# Patient Record
Sex: Female | Born: 1950 | Race: White | Hispanic: No | Marital: Married | Smoking: Never smoker
Health system: Southern US, Community
[De-identification: ages and names within clinical notes are randomized; demographics above are authoritative.]

## PROBLEM LIST (undated history)

## (undated) DIAGNOSIS — Z9889 Other specified postprocedural states: Secondary | ICD-10-CM

## (undated) DIAGNOSIS — R112 Nausea with vomiting, unspecified: Secondary | ICD-10-CM

## (undated) DIAGNOSIS — E785 Hyperlipidemia, unspecified: Secondary | ICD-10-CM

## (undated) DIAGNOSIS — J45909 Unspecified asthma, uncomplicated: Secondary | ICD-10-CM

## (undated) HISTORY — PX: BREAST BIOPSY: SHX20

---

## 1981-06-22 HISTORY — PX: DIAGNOSTIC LAPAROSCOPY: SUR761

## 2006-06-21 ENCOUNTER — Encounter: Admission: RE | Admit: 2006-06-21 | Discharge: 2006-06-21 | Payer: Self-pay | Admitting: Family Medicine

## 2006-06-23 ENCOUNTER — Encounter: Admission: RE | Admit: 2006-06-23 | Discharge: 2006-06-23 | Payer: Self-pay | Admitting: Family Medicine

## 2006-06-23 ENCOUNTER — Encounter (INDEPENDENT_AMBULATORY_CARE_PROVIDER_SITE_OTHER): Payer: Self-pay | Admitting: *Deleted

## 2007-01-04 ENCOUNTER — Encounter: Admission: RE | Admit: 2007-01-04 | Discharge: 2007-01-04 | Payer: Self-pay | Admitting: Family Medicine

## 2007-08-04 ENCOUNTER — Encounter: Admission: RE | Admit: 2007-08-04 | Discharge: 2007-08-04 | Payer: Self-pay | Admitting: Family Medicine

## 2007-08-17 ENCOUNTER — Encounter: Admission: RE | Admit: 2007-08-17 | Discharge: 2007-08-17 | Payer: Self-pay | Admitting: Family Medicine

## 2008-06-04 ENCOUNTER — Encounter: Admission: RE | Admit: 2008-06-04 | Discharge: 2008-06-04 | Payer: Self-pay | Admitting: Family Medicine

## 2009-07-29 ENCOUNTER — Encounter: Admission: RE | Admit: 2009-07-29 | Discharge: 2009-07-29 | Payer: Self-pay | Admitting: Family Medicine

## 2009-08-06 ENCOUNTER — Encounter: Admission: RE | Admit: 2009-08-06 | Discharge: 2009-08-06 | Payer: Self-pay | Admitting: Family Medicine

## 2010-07-12 ENCOUNTER — Other Ambulatory Visit: Payer: Self-pay | Admitting: Family Medicine

## 2010-07-12 DIAGNOSIS — Z1239 Encounter for other screening for malignant neoplasm of breast: Secondary | ICD-10-CM

## 2010-08-07 ENCOUNTER — Ambulatory Visit
Admission: RE | Admit: 2010-08-07 | Discharge: 2010-08-07 | Disposition: A | Discharge status: Home / Self Care | Payer: BC Managed Care – PPO | Source: Ambulatory Visit | Attending: Family Medicine | Admitting: Family Medicine

## 2010-08-07 DIAGNOSIS — Z1239 Encounter for other screening for malignant neoplasm of breast: Secondary | ICD-10-CM

## 2011-08-26 ENCOUNTER — Other Ambulatory Visit: Payer: Self-pay | Admitting: Family Medicine

## 2011-08-26 DIAGNOSIS — Z1231 Encounter for screening mammogram for malignant neoplasm of breast: Secondary | ICD-10-CM

## 2011-09-07 ENCOUNTER — Ambulatory Visit
Admission: RE | Admit: 2011-09-07 | Discharge: 2011-09-07 | Disposition: A | Discharge status: Home / Self Care | Payer: BC Managed Care – PPO | Source: Ambulatory Visit | Attending: Family Medicine | Admitting: Family Medicine

## 2011-09-07 DIAGNOSIS — Z1231 Encounter for screening mammogram for malignant neoplasm of breast: Secondary | ICD-10-CM

## 2013-05-17 ENCOUNTER — Other Ambulatory Visit: Payer: Self-pay

## 2013-05-17 DIAGNOSIS — Z1231 Encounter for screening mammogram for malignant neoplasm of breast: Secondary | ICD-10-CM

## 2013-06-26 ENCOUNTER — Ambulatory Visit
Admission: RE | Admit: 2013-06-26 | Discharge: 2013-06-26 | Disposition: A | Discharge status: Home / Self Care | Payer: BC Managed Care – PPO | Source: Ambulatory Visit

## 2013-06-26 DIAGNOSIS — Z1231 Encounter for screening mammogram for malignant neoplasm of breast: Secondary | ICD-10-CM

## 2013-11-30 DIAGNOSIS — J45909 Unspecified asthma, uncomplicated: Secondary | ICD-10-CM | POA: Insufficient documentation

## 2013-11-30 DIAGNOSIS — Z0000 Encounter for general adult medical examination without abnormal findings: Secondary | ICD-10-CM | POA: Insufficient documentation

## 2013-12-19 DIAGNOSIS — M545 Low back pain, unspecified: Secondary | ICD-10-CM | POA: Insufficient documentation

## 2014-12-28 ENCOUNTER — Other Ambulatory Visit: Payer: Self-pay

## 2014-12-28 DIAGNOSIS — Z1231 Encounter for screening mammogram for malignant neoplasm of breast: Secondary | ICD-10-CM

## 2015-01-01 ENCOUNTER — Ambulatory Visit
Admission: RE | Admit: 2015-01-01 | Discharge: 2015-01-01 | Disposition: A | Discharge status: Home / Self Care | Payer: BLUE CROSS/BLUE SHIELD | Source: Ambulatory Visit

## 2015-01-01 DIAGNOSIS — Z1231 Encounter for screening mammogram for malignant neoplasm of breast: Secondary | ICD-10-CM

## 2015-06-10 ENCOUNTER — Other Ambulatory Visit: Payer: Self-pay | Admitting: Neurosurgery

## 2015-06-10 DIAGNOSIS — M48061 Spinal stenosis, lumbar region without neurogenic claudication: Secondary | ICD-10-CM

## 2015-06-14 ENCOUNTER — Ambulatory Visit
Admission: RE | Admit: 2015-06-14 | Discharge: 2015-06-14 | Disposition: A | Discharge status: Home / Self Care | Payer: BLUE CROSS/BLUE SHIELD | Source: Ambulatory Visit | Attending: Neurosurgery | Admitting: Neurosurgery

## 2015-06-14 DIAGNOSIS — M48061 Spinal stenosis, lumbar region without neurogenic claudication: Secondary | ICD-10-CM

## 2015-06-14 MED ORDER — METHYLPREDNISOLONE ACETATE 40 MG/ML INJ SUSP (RADIOLOG
120.0000 mg | Freq: Once | INTRAMUSCULAR | Status: AC
  Administered 2015-06-14: 120 mg via EPIDURAL

## 2015-06-14 MED ORDER — IOHEXOL 180 MG/ML  SOLN
1.0000 mL | Freq: Once | INTRAMUSCULAR | Status: AC | PRN
  Administered 2015-06-14: 1 mL via EPIDURAL

## 2015-06-14 NOTE — Discharge Instructions (Signed)

## 2015-06-25 ENCOUNTER — Other Ambulatory Visit: Payer: BLUE CROSS/BLUE SHIELD

## 2015-09-02 ENCOUNTER — Other Ambulatory Visit: Payer: Self-pay | Admitting: Neurosurgery

## 2015-10-09 ENCOUNTER — Encounter (HOSPITAL_COMMUNITY)
Admission: RE | Admit: 2015-10-09 | Discharge: 2015-10-09 | Disposition: A | Discharge status: Home / Self Care | Payer: BLUE CROSS/BLUE SHIELD | Source: Ambulatory Visit | Attending: Neurosurgery | Admitting: Neurosurgery

## 2015-10-09 ENCOUNTER — Encounter (HOSPITAL_COMMUNITY): Payer: Self-pay

## 2015-10-09 ENCOUNTER — Other Ambulatory Visit (HOSPITAL_COMMUNITY): Payer: Self-pay | Admitting: *Deleted

## 2015-10-09 DIAGNOSIS — Z01812 Encounter for preprocedural laboratory examination: Secondary | ICD-10-CM | POA: Insufficient documentation

## 2015-10-09 DIAGNOSIS — Z0183 Encounter for blood typing: Secondary | ICD-10-CM | POA: Diagnosis not present

## 2015-10-09 DIAGNOSIS — M4316 Spondylolisthesis, lumbar region: Secondary | ICD-10-CM | POA: Diagnosis not present

## 2015-10-09 HISTORY — DX: Unspecified asthma, uncomplicated: J45.909

## 2015-10-09 HISTORY — DX: Hyperlipidemia, unspecified: E78.5

## 2015-10-09 HISTORY — DX: Nausea with vomiting, unspecified: R11.2

## 2015-10-09 HISTORY — DX: Other specified postprocedural states: Z98.890

## 2015-10-09 LAB — SURGICAL PCR SCREEN
MRSA, PCR: NEGATIVE
Staphylococcus aureus: POSITIVE — AB

## 2015-10-09 LAB — CBC
HEMATOCRIT: 41.5 % (ref 36.0–46.0)
HEMOGLOBIN: 13.3 g/dL (ref 12.0–15.0)
MCH: 28.5 pg (ref 26.0–34.0)
MCHC: 32 g/dL (ref 30.0–36.0)
MCV: 89.1 fL (ref 78.0–100.0)
Platelets: 221 10*3/uL (ref 150–400)
RBC: 4.66 MIL/uL (ref 3.87–5.11)
RDW: 13 % (ref 11.5–15.5)
WBC: 4.8 10*3/uL (ref 4.0–10.5)

## 2015-10-09 LAB — TYPE AND SCREEN
ABO/RH(D): B POS
ANTIBODY SCREEN: NEGATIVE

## 2015-10-09 LAB — ABO/RH: ABO/RH(D): B POS

## 2015-10-09 NOTE — Pre-Procedure Instructions (Signed)
    Bianca Andrews  10/09/2015      [id number] [place], [place] - [address] [place] [id number] Phone: [phone] Fax: [phone]    Your procedure is scheduled on 10-17-2015  Thursday .  [facility]:30 A.M.   Call this number if you have problems the morning of surgery:  [phone]   Remember:  Do not eat food or drink liquids after midnight.   Take these medicines the morning of surgery with A SIP OF WATER Tylenol if needed,albuterol inhaler if needed,Advair Diskus, venlafaxine(Effexor)             Stop all Aspirin,Ibuprofen,Aleve Advil ,Goody,s powders,vitamins over the counter supplements 5 days prior  to Surgery     Do not wear jewelry, make-up or nail polish.  Do not wear lotions, powders, or perfumes.  You may not wear deodorant.  Do not shave 48 hours prior to surgery.     Do not bring valuables to the hospital.  [facility] is not responsible for any belongings or valuables.  Contacts, dentures or bridgework may not be worn into surgery.  Leave your suitcase in the car.  After surgery it may be brought to your room.  For patients admitted to the hospital, discharge time will be determined by your treatment team.  Patients discharged the day of surgery will not be allowed to drive home.    Special instructions:  See attached Sheet for instructions on CHG showers  Please read over the following fact sheets that you were given. Pain Booklet and Surgical Site Infection Prevention

## 2015-10-09 NOTE — Progress Notes (Signed)
Pt. Notified of results of PCR. Mupirocin Rx. [facility] [place].

## 2015-10-16 MED ORDER — CEFAZOLIN SODIUM-DEXTROSE 2-4 GM/100ML-% IV SOLN
2.0000 g | INTRAVENOUS | Status: AC
  Administered 2015-10-17: 2 g via INTRAVENOUS
  Filled 2015-10-16: qty 100

## 2015-10-17 ENCOUNTER — Inpatient Hospital Stay (HOSPITAL_COMMUNITY): Payer: BLUE CROSS/BLUE SHIELD | Admitting: Certified Registered Nurse Anesthetist

## 2015-10-17 ENCOUNTER — Inpatient Hospital Stay (HOSPITAL_COMMUNITY)
Admission: RE | Admit: 2015-10-17 | Discharge: 2015-10-18 | DRG: 460 | Disposition: A | Discharge status: Home / Self Care | Payer: BLUE CROSS/BLUE SHIELD | Source: Ambulatory Visit | Attending: Neurosurgery | Admitting: Neurosurgery

## 2015-10-17 ENCOUNTER — Inpatient Hospital Stay (HOSPITAL_COMMUNITY): Payer: BLUE CROSS/BLUE SHIELD

## 2015-10-17 ENCOUNTER — Encounter (HOSPITAL_COMMUNITY)
Admission: RE | Disposition: A | Discharge status: Home / Self Care | Payer: Self-pay | Source: Ambulatory Visit | Attending: Neurosurgery

## 2015-10-17 ENCOUNTER — Encounter (HOSPITAL_COMMUNITY): Payer: Self-pay | Admitting: General Practice

## 2015-10-17 DIAGNOSIS — M4806 Spinal stenosis, lumbar region: Principal | ICD-10-CM | POA: Diagnosis present

## 2015-10-17 DIAGNOSIS — J45909 Unspecified asthma, uncomplicated: Secondary | ICD-10-CM | POA: Diagnosis present

## 2015-10-17 DIAGNOSIS — M5116 Intervertebral disc disorders with radiculopathy, lumbar region: Secondary | ICD-10-CM | POA: Diagnosis present

## 2015-10-17 DIAGNOSIS — M549 Dorsalgia, unspecified: Secondary | ICD-10-CM | POA: Diagnosis present

## 2015-10-17 DIAGNOSIS — E7800 Pure hypercholesterolemia, unspecified: Secondary | ICD-10-CM | POA: Diagnosis present

## 2015-10-17 DIAGNOSIS — M4316 Spondylolisthesis, lumbar region: Secondary | ICD-10-CM | POA: Diagnosis present

## 2015-10-17 DIAGNOSIS — Z419 Encounter for procedure for purposes other than remedying health state, unspecified: Secondary | ICD-10-CM

## 2015-10-17 HISTORY — PX: MAXIMUM ACCESS (MAS)POSTERIOR LUMBAR INTERBODY FUSION (PLIF) 1 LEVEL: SHX6368

## 2015-10-17 SURGERY — FOR MAXIMUM ACCESS (MAS) POSTERIOR LUMBAR INTERBODY FUSION (PLIF) 1 LEVEL
Anesthesia: General | Site: Back

## 2015-10-17 MED ORDER — FLEET ENEMA 7-19 GM/118ML RE ENEM: 1.0000 | ENEMA | Freq: Once | RECTAL | Status: DC | PRN

## 2015-10-17 MED ORDER — LIDOCAINE-EPINEPHRINE 1 %-1:100000 IJ SOLN
INTRAMUSCULAR | Status: DC | PRN
  Administered 2015-10-17: 5 mL

## 2015-10-17 MED ORDER — CALCIUM CARBONATE-VITAMIN D 500-200 MG-UNIT PO TABS
2.0000 | ORAL_TABLET | Freq: Every day | ORAL | Status: DC
Start: 2015-10-18 — End: 2015-10-18
  Administered 2015-10-18: 2 via ORAL
  Filled 2015-10-17: qty 2

## 2015-10-17 MED ORDER — ACETAMINOPHEN 500 MG PO TABS: 1000.0000 mg | ORAL_TABLET | Freq: Two times a day (BID) | ORAL | Status: DC | PRN

## 2015-10-17 MED ORDER — HYDROMORPHONE HCL 1 MG/ML IJ SOLN: 0.5000 mg | INTRAMUSCULAR | Status: DC | PRN

## 2015-10-17 MED ORDER — 0.9 % SODIUM CHLORIDE (POUR BTL) OPTIME
TOPICAL | Status: DC | PRN
  Administered 2015-10-17: 1000 mL

## 2015-10-17 MED ORDER — DEXTROSE 5 % IV SOLN
10.0000 mg | INTRAVENOUS | Status: DC | PRN
  Administered 2015-10-17: 30 ug/min via INTRAVENOUS

## 2015-10-17 MED ORDER — ADULT MULTIVITAMIN W/MINERALS CH
1.0000 | ORAL_TABLET | Freq: Every day | ORAL | Status: DC
  Filled 2015-10-17: qty 1

## 2015-10-17 MED ORDER — PROPOFOL 10 MG/ML IV BOLUS
INTRAVENOUS | Status: AC
  Filled 2015-10-17: qty 20

## 2015-10-17 MED ORDER — PANTOPRAZOLE SODIUM 40 MG PO TBEC
40.0000 mg | DELAYED_RELEASE_TABLET | Freq: Every day | ORAL | Status: DC
  Administered 2015-10-17 – 2015-10-18 (×2): 40 mg via ORAL
  Filled 2015-10-17 (×2): qty 1

## 2015-10-17 MED ORDER — BUPIVACAINE LIPOSOME 1.3 % IJ SUSP
INTRAMUSCULAR | Status: DC | PRN
  Administered 2015-10-17: 20 mL

## 2015-10-17 MED ORDER — DEXAMETHASONE SODIUM PHOSPHATE 10 MG/ML IJ SOLN
INTRAMUSCULAR | Status: AC
  Filled 2015-10-17: qty 1

## 2015-10-17 MED ORDER — SCOPOLAMINE 1 MG/3DAYS TD PT72
MEDICATED_PATCH | TRANSDERMAL | Status: DC
Start: 2015-10-17 — End: 2015-10-18
  Administered 2015-10-17: 1 via TRANSDERMAL
  Filled 2015-10-17: qty 1

## 2015-10-17 MED ORDER — ZOLPIDEM TARTRATE 5 MG PO TABS: 5.0000 mg | ORAL_TABLET | Freq: Every evening | ORAL | Status: DC | PRN

## 2015-10-17 MED ORDER — ACETAMINOPHEN 10 MG/ML IV SOLN
INTRAVENOUS | Status: AC
  Administered 2015-10-17: 1000 mg via INTRAVENOUS
  Filled 2015-10-17: qty 100

## 2015-10-17 MED ORDER — LACTATED RINGERS IV SOLN
INTRAVENOUS | Status: DC
  Administered 2015-10-17 (×2): via INTRAVENOUS

## 2015-10-17 MED ORDER — BUPIVACAINE HCL (PF) 0.5 % IJ SOLN
INTRAMUSCULAR | Status: DC | PRN
  Administered 2015-10-17: 5 mL

## 2015-10-17 MED ORDER — KCL IN DEXTROSE-NACL 20-5-0.45 MEQ/L-%-% IV SOLN: INTRAVENOUS | Status: DC

## 2015-10-17 MED ORDER — MOMETASONE FURO-FORMOTEROL FUM 100-5 MCG/ACT IN AERO
2.0000 | INHALATION_SPRAY | Freq: Two times a day (BID) | RESPIRATORY_TRACT | Status: DC
  Administered 2015-10-18: 2 via RESPIRATORY_TRACT
  Filled 2015-10-17: qty 8.8

## 2015-10-17 MED ORDER — SENNOSIDES-DOCUSATE SODIUM 8.6-50 MG PO TABS: 1.0000 | ORAL_TABLET | Freq: Every evening | ORAL | Status: DC | PRN

## 2015-10-17 MED ORDER — CEFAZOLIN SODIUM-DEXTROSE 2-4 GM/100ML-% IV SOLN
2.0000 g | Freq: Three times a day (TID) | INTRAVENOUS | Status: AC
  Administered 2015-10-17 (×2): 2 g via INTRAVENOUS
  Filled 2015-10-17 (×2): qty 100

## 2015-10-17 MED ORDER — SUCCINYLCHOLINE CHLORIDE 20 MG/ML IJ SOLN
INTRAMUSCULAR | Status: DC | PRN
  Administered 2015-10-17: 100 mg via INTRAVENOUS

## 2015-10-17 MED ORDER — ONDANSETRON HCL 4 MG/2ML IJ SOLN: 4.0000 mg | INTRAMUSCULAR | Status: DC | PRN

## 2015-10-17 MED ORDER — ALBUTEROL SULFATE (2.5 MG/3ML) 0.083% IN NEBU: 2.5000 mg | INHALATION_SOLUTION | Freq: Four times a day (QID) | RESPIRATORY_TRACT | Status: DC | PRN

## 2015-10-17 MED ORDER — ONDANSETRON HCL 4 MG/2ML IJ SOLN
INTRAMUSCULAR | Status: AC
  Filled 2015-10-17: qty 2

## 2015-10-17 MED ORDER — ONDANSETRON HCL 4 MG/2ML IJ SOLN
INTRAMUSCULAR | Status: DC | PRN
  Administered 2015-10-17: 4 mg via INTRAVENOUS

## 2015-10-17 MED ORDER — BUPIVACAINE LIPOSOME 1.3 % IJ SUSP
20.0000 mL | INTRAMUSCULAR | Status: DC
  Filled 2015-10-17: qty 20

## 2015-10-17 MED ORDER — CALCIUM CARB-CHOLECALCIFEROL 600-800 MG-UNIT PO TABS: 2.0000 | ORAL_TABLET | Freq: Every day | ORAL | Status: DC

## 2015-10-17 MED ORDER — METHOCARBAMOL 500 MG PO TABS
500.0000 mg | ORAL_TABLET | Freq: Four times a day (QID) | ORAL | Status: DC | PRN
  Administered 2015-10-17 – 2015-10-18 (×3): 500 mg via ORAL
  Filled 2015-10-17 (×3): qty 1

## 2015-10-17 MED ORDER — SUCCINYLCHOLINE CHLORIDE 200 MG/10ML IV SOSY
PREFILLED_SYRINGE | INTRAVENOUS | Status: AC
  Filled 2015-10-17: qty 10

## 2015-10-17 MED ORDER — MENTHOL 3 MG MT LOZG: 1.0000 | LOZENGE | OROMUCOSAL | Status: DC | PRN

## 2015-10-17 MED ORDER — BISACODYL 10 MG RE SUPP: 10.0000 mg | Freq: Every day | RECTAL | Status: DC | PRN

## 2015-10-17 MED ORDER — ACETAMINOPHEN 650 MG RE SUPP: 650.0000 mg | RECTAL | Status: DC | PRN

## 2015-10-17 MED ORDER — DEXAMETHASONE SODIUM PHOSPHATE 10 MG/ML IJ SOLN
INTRAMUSCULAR | Status: DC | PRN
  Administered 2015-10-17: 10 mg via INTRAVENOUS

## 2015-10-17 MED ORDER — DIPHENHYDRAMINE HCL 50 MG/ML IJ SOLN
INTRAMUSCULAR | Status: DC | PRN
  Administered 2015-10-17: 12.5 mg via INTRAVENOUS

## 2015-10-17 MED ORDER — METHOCARBAMOL 1000 MG/10ML IJ SOLN
500.0000 mg | Freq: Four times a day (QID) | INTRAVENOUS | Status: DC | PRN
  Filled 2015-10-17: qty 5

## 2015-10-17 MED ORDER — PHENOL 1.4 % MT LIQD: 1.0000 | OROMUCOSAL | Status: DC | PRN

## 2015-10-17 MED ORDER — PROPOFOL 10 MG/ML IV BOLUS
INTRAVENOUS | Status: DC | PRN
  Administered 2015-10-17: 150 mg via INTRAVENOUS
  Administered 2015-10-17: 50 mg via INTRAVENOUS

## 2015-10-17 MED ORDER — THROMBIN 20000 UNITS EX SOLR
CUTANEOUS | Status: DC | PRN
  Administered 2015-10-17: 20 mL via TOPICAL

## 2015-10-17 MED ORDER — OXYCODONE-ACETAMINOPHEN 5-325 MG PO TABS
1.0000 | ORAL_TABLET | ORAL | Status: DC | PRN
  Administered 2015-10-17 – 2015-10-18 (×3): 1 via ORAL
  Filled 2015-10-17 (×3): qty 1

## 2015-10-17 MED ORDER — SODIUM CHLORIDE 0.9% FLUSH
3.0000 mL | Freq: Two times a day (BID) | INTRAVENOUS | Status: DC
  Administered 2015-10-17 (×2): 3 mL via INTRAVENOUS

## 2015-10-17 MED ORDER — ACETAMINOPHEN 325 MG PO TABS: 650.0000 mg | ORAL_TABLET | ORAL | Status: DC | PRN

## 2015-10-17 MED ORDER — ARTIFICIAL TEARS OP OINT
TOPICAL_OINTMENT | OPHTHALMIC | Status: AC
  Filled 2015-10-17: qty 3.5

## 2015-10-17 MED ORDER — HYDROCODONE-ACETAMINOPHEN 5-325 MG PO TABS: 1.0000 | ORAL_TABLET | ORAL | Status: DC | PRN

## 2015-10-17 MED ORDER — ROCURONIUM BROMIDE 50 MG/5ML IV SOLN
INTRAVENOUS | Status: AC
  Filled 2015-10-17: qty 1

## 2015-10-17 MED ORDER — PANTOPRAZOLE SODIUM 40 MG IV SOLR: 40.0000 mg | Freq: Every day | INTRAVENOUS | Status: DC

## 2015-10-17 MED ORDER — FENTANYL CITRATE (PF) 100 MCG/2ML IJ SOLN
INTRAMUSCULAR | Status: DC | PRN
  Administered 2015-10-17 (×3): 50 ug via INTRAVENOUS
  Administered 2015-10-17: 100 ug via INTRAVENOUS

## 2015-10-17 MED ORDER — FENTANYL CITRATE (PF) 250 MCG/5ML IJ SOLN
INTRAMUSCULAR | Status: AC
  Filled 2015-10-17: qty 5

## 2015-10-17 MED ORDER — MIDAZOLAM HCL 2 MG/2ML IJ SOLN
INTRAMUSCULAR | Status: AC
  Filled 2015-10-17: qty 2

## 2015-10-17 MED ORDER — MIDAZOLAM HCL 5 MG/5ML IJ SOLN
INTRAMUSCULAR | Status: DC | PRN
  Administered 2015-10-17: 2 mg via INTRAVENOUS

## 2015-10-17 MED ORDER — SODIUM CHLORIDE 0.9% FLUSH: 3.0000 mL | INTRAVENOUS | Status: DC | PRN

## 2015-10-17 MED ORDER — LIDOCAINE HCL (CARDIAC) 20 MG/ML IV SOLN
INTRAVENOUS | Status: DC | PRN
  Administered 2015-10-17: 40 mg via INTRATRACHEAL
  Administered 2015-10-17: 60 mg via INTRAVENOUS

## 2015-10-17 MED ORDER — ALUM & MAG HYDROXIDE-SIMETH 200-200-20 MG/5ML PO SUSP: 30.0000 mL | Freq: Four times a day (QID) | ORAL | Status: DC | PRN

## 2015-10-17 MED ORDER — HYDROMORPHONE HCL 1 MG/ML IJ SOLN
INTRAMUSCULAR | Status: AC
  Filled 2015-10-17: qty 1

## 2015-10-17 MED ORDER — HYDROMORPHONE HCL 1 MG/ML IJ SOLN
0.2500 mg | INTRAMUSCULAR | Status: DC | PRN
  Administered 2015-10-17: 0.5 mg via INTRAVENOUS

## 2015-10-17 MED ORDER — DOCUSATE SODIUM 100 MG PO CAPS
100.0000 mg | ORAL_CAPSULE | Freq: Two times a day (BID) | ORAL | Status: DC
Start: 2015-10-17 — End: 2015-10-18
  Administered 2015-10-17 – 2015-10-18 (×2): 100 mg via ORAL
  Filled 2015-10-17 (×2): qty 1

## 2015-10-17 MED ORDER — DIPHENHYDRAMINE HCL 50 MG/ML IJ SOLN
INTRAMUSCULAR | Status: AC
  Filled 2015-10-17: qty 1

## 2015-10-17 MED ORDER — VENLAFAXINE HCL ER 37.5 MG PO CP24
37.5000 mg | ORAL_CAPSULE | Freq: Every day | ORAL | Status: DC
  Filled 2015-10-17: qty 1

## 2015-10-17 MED ORDER — PROPOFOL 1000 MG/100ML IV EMUL
INTRAVENOUS | Status: AC
  Administered 2015-10-17: 30 ug/kg/min via INTRAVENOUS
  Filled 2015-10-17: qty 100

## 2015-10-17 SURGICAL SUPPLY — 87 items
ADH SKN CLS APL DERMABOND .7 (GAUZE/BANDAGES/DRESSINGS) ×1
APL SKNCLS STERI-STRIP NONHPOA (GAUZE/BANDAGES/DRESSINGS) ×1
BENZOIN TINCTURE PRP APPL 2/3 (GAUZE/BANDAGES/DRESSINGS) ×3 IMPLANT
BIT DRILL PLIF MAS 5.0MM DISP (DRILL) IMPLANT
BLADE CLIPPER SURG (BLADE) IMPLANT
BONE CANC CHIPS 20CC PCAN1/4 (Bone Implant) ×3 IMPLANT
BUR MATCHSTICK NEURO 3.0 LAGG (BURR) ×3 IMPLANT
BUR ROUND FLUTED 5 RND (BURR) ×2 IMPLANT
BUR ROUND FLUTED 5MM RND (BURR) ×1
CAGE COROENT MP 8X9X23M-8 SPIN (Cage) ×4 IMPLANT
CANISTER SUCT 3000ML PPV (MISCELLANEOUS) ×3 IMPLANT
CHIPS CANC BONE 20CC PCAN1/4 (Bone Implant) ×1 IMPLANT
CLIP NEUROVISION LG (CLIP) ×3 IMPLANT
CLOSURE WOUND 1/2 X4 (GAUZE/BANDAGES/DRESSINGS) ×1
CONT SPEC 4OZ CLIKSEAL STRL BL (MISCELLANEOUS) ×3 IMPLANT
COVER BACK TABLE 24X17X13 BIG (DRAPES) IMPLANT
COVER BACK TABLE 60X90IN (DRAPES) ×3 IMPLANT
DECANTER SPIKE VIAL GLASS SM (MISCELLANEOUS) ×3 IMPLANT
DERMABOND ADVANCED (GAUZE/BANDAGES/DRESSINGS) ×2
DERMABOND ADVANCED .7 DNX12 (GAUZE/BANDAGES/DRESSINGS) ×1 IMPLANT
DRAPE C-ARM 42X72 X-RAY (DRAPES) ×3 IMPLANT
DRAPE C-ARMOR (DRAPES) ×3 IMPLANT
DRAPE LAPAROTOMY 100X72X124 (DRAPES) ×3 IMPLANT
DRAPE POUCH INSTRU U-SHP 10X18 (DRAPES) ×3 IMPLANT
DRAPE SURG 17X23 STRL (DRAPES) ×3 IMPLANT
DRILL PLIF MAS 5.0MM DISP (DRILL) ×3
DRSG OPSITE POSTOP 4X6 (GAUZE/BANDAGES/DRESSINGS) ×2 IMPLANT
DURAPREP 26ML APPLICATOR (WOUND CARE) ×3 IMPLANT
ELECT REM PT RETURN 9FT ADLT (ELECTROSURGICAL) ×3
ELECTRODE REM PT RTRN 9FT ADLT (ELECTROSURGICAL) ×1 IMPLANT
EVACUATOR 1/8 PVC DRAIN (DRAIN) ×3 IMPLANT
GAUZE SPONGE 4X4 12PLY STRL (GAUZE/BANDAGES/DRESSINGS) ×3 IMPLANT
GAUZE SPONGE 4X4 16PLY XRAY LF (GAUZE/BANDAGES/DRESSINGS) IMPLANT
GLOVE BIO SURGEON STRL SZ8 (GLOVE) ×3 IMPLANT
GLOVE BIOGEL PI IND STRL 8 (GLOVE) ×1 IMPLANT
GLOVE BIOGEL PI IND STRL 8.5 (GLOVE) ×1 IMPLANT
GLOVE BIOGEL PI INDICATOR 8 (GLOVE) ×2
GLOVE BIOGEL PI INDICATOR 8.5 (GLOVE) ×2
GLOVE ECLIPSE 8.0 STRL XLNG CF (GLOVE) ×3 IMPLANT
GLOVE EXAM NITRILE LRG STRL (GLOVE) IMPLANT
GLOVE EXAM NITRILE MD LF STRL (GLOVE) IMPLANT
GLOVE EXAM NITRILE XL STR (GLOVE) IMPLANT
GLOVE EXAM NITRILE XS STR PU (GLOVE) IMPLANT
GOWN STRL REUS W/ TWL LRG LVL3 (GOWN DISPOSABLE) IMPLANT
GOWN STRL REUS W/ TWL XL LVL3 (GOWN DISPOSABLE) ×1 IMPLANT
GOWN STRL REUS W/TWL 2XL LVL3 (GOWN DISPOSABLE) ×3 IMPLANT
GOWN STRL REUS W/TWL LRG LVL3 (GOWN DISPOSABLE)
GOWN STRL REUS W/TWL XL LVL3 (GOWN DISPOSABLE) ×3
GRAFT BNE CANC CHIPS 1-8 20CC (Bone Implant) IMPLANT
KIT BASIN OR (CUSTOM PROCEDURE TRAY) ×3 IMPLANT
KIT NEEDLE NVM5 EMG ELECT (KITS) ×1 IMPLANT
KIT NEEDLE NVM5 EMG ELECTRODE (KITS) ×2
KIT POSITION SURG JACKSON T1 (MISCELLANEOUS) ×3 IMPLANT
KIT ROOM TURNOVER OR (KITS) ×3 IMPLANT
MILL MEDIUM DISP (BLADE) ×3 IMPLANT
NDL HYPO 25X1 1.5 SAFETY (NEEDLE) ×1 IMPLANT
NDL SPNL 18GX3.5 QUINCKE PK (NEEDLE) IMPLANT
NEEDLE HYPO 25X1 1.5 SAFETY (NEEDLE) ×3 IMPLANT
NEEDLE SPNL 18GX3.5 QUINCKE PK (NEEDLE) IMPLANT
NS IRRIG 1000ML POUR BTL (IV SOLUTION) ×3 IMPLANT
PACK LAMINECTOMY NEURO (CUSTOM PROCEDURE TRAY) ×3 IMPLANT
PAD ARMBOARD 7.5X6 YLW CONV (MISCELLANEOUS) ×9 IMPLANT
PATTIES SURGICAL .5 X.5 (GAUZE/BANDAGES/DRESSINGS) IMPLANT
PATTIES SURGICAL .5 X1 (DISPOSABLE) IMPLANT
PATTIES SURGICAL 1X1 (DISPOSABLE) IMPLANT
ROD 5.5X40MM (Rod) ×4 IMPLANT
SCREW LOCK (Screw) ×12 IMPLANT
SCREW LOCK FXNS SPNE MAS PL (Screw) IMPLANT
SCREW MAS PLIF 5.5X30 (Screw) ×2 IMPLANT
SCREW PAS PLIF 5X30 (Screw) IMPLANT
SCREW SHANK 5.0X35 (Screw) ×6 IMPLANT
SCREW TULIP 5.5 (Screw) ×6 IMPLANT
SPONGE LAP 4X18 X RAY DECT (DISPOSABLE) IMPLANT
SPONGE SURGIFOAM ABS GEL 100 (HEMOSTASIS) ×3 IMPLANT
STAPLER SKIN PROX WIDE 3.9 (STAPLE) IMPLANT
STRIP CLOSURE SKIN 1/2X4 (GAUZE/BANDAGES/DRESSINGS) ×2 IMPLANT
SUT VIC AB 1 CT1 18XBRD ANBCTR (SUTURE) ×2 IMPLANT
SUT VIC AB 1 CT1 8-18 (SUTURE) ×6
SUT VIC AB 2-0 CT1 18 (SUTURE) ×6 IMPLANT
SUT VIC AB 3-0 SH 8-18 (SUTURE) ×6 IMPLANT
SYR 5ML LL (SYRINGE) IMPLANT
TAPE STRIPS DRAPE STRL (GAUZE/BANDAGES/DRESSINGS) ×2 IMPLANT
TOWEL OR 17X24 6PK STRL BLUE (TOWEL DISPOSABLE) ×3 IMPLANT
TOWEL OR 17X26 10 PK STRL BLUE (TOWEL DISPOSABLE) ×3 IMPLANT
TRAP SPECIMEN MUCOUS 40CC (MISCELLANEOUS) ×3 IMPLANT
TRAY FOLEY W/METER SILVER 14FR (SET/KITS/TRAYS/PACK) ×3 IMPLANT
WATER STERILE IRR 1000ML POUR (IV SOLUTION) ×3 IMPLANT

## 2015-10-17 NOTE — Anesthesia Procedure Notes (Signed)
Procedure Name: Intubation Date/Time: 10/17/2015 9:35 AM Performed by: [place] Pink Pre-anesthesia Checklist: Patient identified, Emergency Drugs available, Suction available, Patient being monitored and Timeout performed Patient Re-evaluated:Patient Re-evaluated prior to inductionOxygen Delivery Method: Circle system utilized Preoxygenation: Pre-oxygenation with 100% oxygen Intubation Type: IV induction Ventilation: Mask ventilation without difficulty Laryngoscope Size: Mac and 3 Grade View: Grade I Tube type: Oral Tube size: 7.0 mm Number of attempts: 1 Airway Equipment and Method: Stylet and LTA kit utilized Placement Confirmation: ETT inserted through vocal cords under direct vision,  positive ETCO2 and breath sounds checked- equal and bilateral Secured at: 20 cm Tube secured with: Tape Dental Injury: Teeth and Oropharynx as per pre-operative assessment

## 2015-10-17 NOTE — Anesthesia Preprocedure Evaluation (Signed)
Anesthesia Evaluation  Patient identified by MRN, date of birth, ID band Patient awake    Reviewed: Allergy & Precautions, NPO status , Patient's Chart, lab work & pertinent test results  History of Anesthesia Complications (+) PONV  Airway Mallampati: II  TM Distance: >3 FB Neck ROM: Full    Dental   Pulmonary asthma ,    breath sounds clear to auscultation       Cardiovascular negative cardio ROS   Rhythm:Regular Rate:Normal     Neuro/Psych    GI/Hepatic negative GI ROS, Neg liver ROS,   Endo/Other  negative endocrine ROS  Renal/GU negative Renal ROS     Musculoskeletal   Abdominal   Peds  Hematology   Anesthesia Other Findings   Reproductive/Obstetrics                             Anesthesia Physical Anesthesia Plan  ASA: III  Anesthesia Plan: General   Post-op Pain Management:    Induction: Intravenous  Airway Management Planned: Oral ETT  Additional Equipment:   Intra-op Plan:   Post-operative Plan: Extubation in OR  Informed Consent: I have reviewed the patients History and Physical, chart, labs and discussed the procedure including the risks, benefits and alternatives for the proposed anesthesia with the patient or authorized representative who has indicated his/her understanding and acceptance.   Dental advisory given  Plan Discussed with: CRNA and Anesthesiologist  Anesthesia Plan Comments:         Anesthesia Quick Evaluation

## 2015-10-17 NOTE — Interval H&P Note (Signed)
History and Physical Interval Note:  10/17/2015 9:18 AM  Bianca Andrews  has presented today for surgery, with the diagnosis of Spondylolisthesis, Lumbar region  The various methods of treatment have been discussed with the patient and family. After consideration of risks, benefits and other options for treatment, the patient has consented to  Procedure(s) with comments: L4-5 Maximum access posterior lumbar interbody fusion (N/A) - L4-5 Maximum access posterior lumbar interbody fusion as a surgical intervention .  The patient's history has been reviewed, patient examined, no change in status, stable for surgery.  I have reviewed the patient's chart and labs.  Questions were answered to the patient's satisfaction.     Clemente Dewey D

## 2015-10-17 NOTE — Anesthesia Postprocedure Evaluation (Signed)
Anesthesia Post Note  Patient: Bianca Andrews  Procedure(s) Performed: Procedure(s) (LRB): L4-5 Maximum access posterior lumbar interbody fusion (N/A)  Patient location during evaluation: PACU Anesthesia Type: General Level of consciousness: awake Pain management: pain level controlled Vital Signs Assessment: post-procedure vital signs reviewed and stable Respiratory status: spontaneous breathing Cardiovascular status: stable Anesthetic complications: no    Last Vitals:  Filed Vitals:   10/17/15 0625 10/17/15 1239  BP: 144/83 126/81  Pulse: 73   Temp: 36.6 C 37.3 C  Resp: 18 16    Last Pain:  Filed Vitals:   10/17/15 1242  PainSc: Asleep                 EDWARDS,Zavior Thomason

## 2015-10-17 NOTE — Transfer of Care (Signed)
Immediate Anesthesia Transfer of Care Note  Patient: Bianca Andrews  Procedure(s) Performed: Procedure(s) with comments: L4-5 Maximum access posterior lumbar interbody fusion (N/A) - L4-5 Maximum access posterior lumbar interbody fusion  Patient Location: PACU  Anesthesia Type:General  Level of Consciousness: sedated  Airway & Oxygen Therapy: Patient Spontanous Breathing and Patient connected to face mask oxygen  Post-op Assessment: Report given to RN, Post -op Vital signs reviewed and stable and Patient moving all extremities X 4  Post vital signs: Reviewed and stable  Last Vitals:  Filed Vitals:   10/17/15 0625  BP: 144/83  Pulse: 73  Temp: 36.6 C  Resp: 18    Last Pain: There were no vitals filed for this visit.    Patients Stated Pain Goal: 2 (10/17/15 [id number])  Complications: No apparent anesthesia complications

## 2015-10-17 NOTE — Progress Notes (Signed)
Awake, sleepy, but arousable.  MAEW with good strength.  Doing well.

## 2015-10-17 NOTE — Op Note (Signed)
10/17/2015  12:14 PM  PATIENT:  Bianca Andrews  65 y.o. female  PRE-OPERATIVE DIAGNOSIS:  Spondylolisthesis L 45, stenosis, radiculopathy, lumbago  POST-OPERATIVE DIAGNOSIS: Spondylolisthesis L 45, stenosis, radiculopathy, lumbago   PROCEDURE:  Procedure(s) with comments: L4-5 Maximum access posterior lumbar interbody fusion (N/A) - L4-5 Maximum access posterior lumbar interbody fusion with PEEK cages, pedicle screw fixation, posterolateral arthrodesis  Decompression greater than standard PLIF procedure  SURGEON:  Surgeon(s) and Role:    * Maeola HarmanJoseph Jhace Fennell, MD - Primary    * Tressie StalkerJeffrey Jenkins, MD - Assisting  PHYSICIAN ASSISTANT:   ASSISTANTS: Poteat, RN   ANESTHESIA:   general  EBL:  Total I/O In: 1000 [I.V.:1000] Out: 375 [Urine:300; Blood:75]  BLOOD ADMINISTERED:none  DRAINS: none   LOCAL MEDICATIONS USED:  MARCAINE    and LIDOCAINE   SPECIMEN:  No Specimen  DISPOSITION OF SPECIMEN:  N/A  COUNTS:  YES  TOURNIQUET:  * No tourniquets in log *  DICTATION: Patient is a 65 year old with spondylosis , stenosis, spondylolisthesis, disc herniation and severe back and bilateral lower extremity pain at L4/5 levels of the lumbar spine. It was elected to take her to surgery for MASPLIF L 45 level with posterolateral arthrodesis.  Procedure:   Following uncomplicated induction of GETA, and placement of electrodes for neural monitoring, patient was turned into a prone position [place] the [place] tableand using AP  fluoroscopy the area of planned incision was marked, prepped with betadine scrub and Duraprep, then draped. Exposure was performed of facet joint complex at L 45 level and the MAS retractor was placed.5.0 x 35 mm cortical Nuvasive screws were placed at L 4 bilaterally according to standard landmarks using neural monitoring.  A total laminectomy of L 4 was then performed with disarticulation of facets.  This bone was saved for grafting, combined with allograft after being run  through bone mill and was placed in bone packing device.  Thorough discectomy was performed bilaterally at L 45 and the endplates were prepared for grafting.  Decompression was greater than standard PLIF with wide decompression of all neural elements, including bilateral L 4 and L 5 nerve roots and thecal sac.  23 x 8 x 8 degree cages were placed in the interspace and positioning was confirmed with AP and lateral fluoroscopy.  10 cc of autograft was packed in the interspace medial to the second cage.   Remaining screws were placed at L 5 and 40 mm rods were placed.   And the screws were locked and torqued.Final Xrays showed well positioned implants and screw fixation. The posterolateral region was packed with remaining 10 cc of autograft on each side of midline. Long-acting Marcaine was injected into subcutaneous tissues. The wounds were irrigated and then closed with 1, 2-0 and 3-0 Vicryl stitches. Sterile occlusive dressing was placed with Dermabond. The patient was then extubated in the operating room and taken to recovery in stable and satisfactory condition having tolerated her operation well. Counts were correct at the end of the case.  PLAN OF CARE: Admit to inpatient   PATIENT DISPOSITION:  PACU - hemodynamically stable.   Delay start of Pharmacological VTE agent (>24hrs) due to surgical blood loss or risk of bleeding: yes

## 2015-10-17 NOTE — H&P (Signed)
Patient ID:   [id number] Patient: Bianca Andrews  Date of Birth: [birth year] Visit Type: Office Visit   Date: 09/02/2015 10:30 AM Provider: Danae Orleans. Venetia Maxon MD   This 65 year old female presents for back pain.  History of Present Illness: 1.  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  She reports onset 3-4 years ago but does not recall an injury. States buttock pain radiates to her knees. Pain 5/10, everyday, 10/10 at its worst. States epirdural injection worked for about a month. Unable to stand up straight for prolonged periods.  ESI December 2016 offered 30 days relief  Advil when necessary  History: Asthma, high cholesterol Surgical history: Diagnostic laparoscopy 30 years ago  MRI and x-rays and CT on [place]        PAST MEDICAL/SURGICAL HISTORY   (Detailed)  Disease/disorder Onset Date Management Date Comments  Asthma    TKJ 09/02/2015 -  High cholesterol         PAST MEDICAL HISTORY, SURGICAL HISTORY, FAMILY HISTORY, SOCIAL HISTORY AND REVIEW OF SYSTEMS I have reviewed the patient's past medical, surgical, family and social history as well as the comprehensive review of systems as included on the [facility] history form dated 09/02/2015, which I have signed.  Family History  (Detailed)   SOCIAL HISTORY  (Detailed) Tobacco use reviewed. Preferred language is Unknown.   Smoking status: Never smoker.  SMOKING STATUS Use Status Type Smoking Status Usage Per Day Years Used Total Pack Years  no/never  Never smoker       HOME ENVIRONMENT/SAFETY The patient has not fallen in the last year.        MEDICATIONS(added, continued or stopped this visit): Started Medication Directions Instruction Stopped   Advair Diskus 100 mcg-50 mcg/dose powder for inhalation inhale 1 puff by inhalation route 2 times every day in the  morning and evening approximately 12 hours apart     Effexor XR 75 mg capsule,extended release take 1 capsule by oral route  every day with food       ALLERGIES: Ingredient Reaction Medication Name Comment  NO KNOWN ALLERGIES     No known allergies.   REVIEW OF SYSTEMS System Neg/Pos Details  Constitutional Negative Chills, fatigue, fever, malaise, night sweats, weight gain and weight loss.  ENMT Negative Ear drainage, hearing loss, nasal drainage, otalgia, sinus pressure and sore throat.  Eyes Negative Eye discharge, eye pain and vision changes.  Respiratory Negative Chronic cough, cough, dyspnea, known TB exposure and wheezing.  Cardio Negative Chest pain, claudication, edema and irregular heartbeat/palpitations.  GI Negative Abdominal pain, blood in stool, change in stool pattern, constipation, decreased appetite, diarrhea, heartburn, nausea and vomiting.  GU Negative Dysuria, hematuria, hot flashes, irregular menses, polyuria, urinary frequency, urinary incontinence and urinary retention.  Endocrine Negative Cold intolerance, heat intolerance, polydipsia and polyphagia.  Neuro Negative Dizziness, extremity weakness, gait disturbance, headache, memory impairment, numbness in extremity, seizures and tremors.  Psych Negative Anxiety, depression and insomnia.  Integumentary Negative Brittle hair, brittle nails, change in shape/size of mole(s), hair loss, hirsutism, hives, pruritus, rash and skin lesion.  MS Positive Leg pain.  MS Negative Back pain, joint pain, joint swelling, muscle weakness and neck pain.  Hema/Lymph Negative Easy bleeding, easy bruising and lymphadenopathy.  Allergic/Immuno Negative Contact allergy, environmental allergies, food allergies and seasonal allergies.  Reproductive Negative Breast discharge, breast lump(s), dysmenorrhea, dyspareunia, history of  abnormal PAP smear and vaginal discharge.     Vitals Date Temp F BP Pulse Ht In Wt Lb BMI BSA Pain Score   09/02/2015  144/93 97 59 116.2 23.47  5/10     PHYSICAL EXAM General Level of Distress: no acute distress Overall Appearance: normal  Head and Face  Right Left  Fundoscopic Exam:  normal normal    Cardiovascular Cardiac: regular rate and rhythm without murmur  Right Left  Carotid Pulses: normal normal  Respiratory Lungs: clear to auscultation  Neurological Orientation: normal Recent and Remote Memory: normal Attention Span and Concentration:   normal Language: normal Fund of Knowledge: normal  Right Left Sensation: normal normal Upper Extremity Coordination: normal normal  Lower Extremity Coordination: normal normal  Musculoskeletal Gait and Station: normal  Right Left Upper Extremity Muscle Strength: normal normal Lower Extremity Muscle Strength: normal normal Upper Extremity Muscle Tone:  normal normal Lower Extremity Muscle Tone: normal normal  Motor Strength Upper and lower extremity motor strength was tested in the clinically pertinent muscles.     Deep Tendon Reflexes  Right Left Biceps: normal normal Triceps: normal normal Brachiloradialis: normal normal Patellar: normal normal Achilles: normal normal  Sensory Sensation was tested at L1 to S1.   Cranial Nerves II. Optic Nerve/Visual Fields: normal III. Oculomotor: normal IV. Trochlear: normal V. Trigeminal: normal VI. Abducens: normal VII. Facial: normal VIII. Acoustic/Vestibular: normal IX. Glossopharyngeal: normal X. Vagus: normal XI. Spinal Accessory: normal XII. Hypoglossal: normal  Motor and other Tests Lhermittes: negative Rhomberg: negative Pronator drift: absent     Right Left Hoffman's: normal normal Clonus: normal normal Babinski: normal normal SLR: negative negative Patrick's Pearlean Brownie(Faber): negative negative Toe Walk: normal normal Toe Lift: normal normal Heel Walk: normal normal SI Joint: nontender nontender   Additional Findings:  Pain standing on toes. No  numbness in feet.    DIAGNOSTIC RESULTS 04/24/15 MRI MRI :Severe spinal stenosis at L4-5 with trapping of the nerve roots. Soft disc protrusion at L3-4 asymmetric to the left compressing the left lateral recess and the left L4 nerve in the lateral recess.  X-ray: L4-5 slippage measurements at 10.5 mm neutro-lateral,10.8 mm extension,11.0 mm flexion. Mild scoliosis.       IMPRESSION The patient has severe L4-5 stenosis causing bilateral buttock pain radiating down to her knees, she can't tell if her left side hurts more at this time. She also has a disc protrusion and mild stenosis L3-4 more so on the left side, however this seems to asymptomatic at this time.  She has neurogenic claudication, she is unable to stand for any length of time before she gets pain. At this time I recommend MAS PLIF at L4-5. She is very physically active, she has done yoga before with no alleviation of symptoms.  Completed Orders (this encounter) Order Details Reason Side Interpretation Result Initial Treatment Date Region  Lifestyle education regarding diet Patient encouraged to eat a well balenced diet.        Lifestyle education Continue to monitor BP if continues to be elevated contact primary care provider         Assessment/Plan # Detail Type Description   1. Assessment Low back pain, unspecified back pain laterality, with sciatica presence unspecified (M54.5).       2. Assessment Radiculopathy, lumbar region (M54.16).       3. Assessment Spinal stenosis of lumbar region (M48.06).       4. Assessment Spondylolisthesis, lumbar region (M43.16).       5. Assessment Body  mass index (BMI) 23.0-23.9, adult (Z61.09).   Plan Orders Today's instructions / counseling include(s) Lifestyle education regarding diet.       6. Assessment Elevated blood-pressure reading, w/o diagnosis of htn (R03.0).         Pain Assessment/Treatment Pain Scale: 5/10. Method: Numeric Pain Intensity Scale. Location:  legs. Onset: 02/21/2015. Duration: varies. Quality: discomforting. Pain Assessment/Treatment follow-up plan of care: Patient taking medication as prescribed.  Fall Risk Plan The patient has not fallen in the last year. Falls risk follow-up plan of care: Assisted devices: Advise to use safety measures when available.  Fitted for back brace and patient education on surgery given. Surgery will be scheduled.    Orders: Instruction(s)/Education: Assessment Instruction  R03.0 Lifestyle education  [phone] Lifestyle education regarding diet             Provider:  Danae Orleans. Venetia Maxon MD  09/02/2015 12:10 PM Dictation edited by: Jamey Ripa    CC Providers: Maeola Harman MD [address] [place], [place] [address]              Electronically signed by Danae Orleans. Venetia Maxon MD on 09/04/2015 06:20 PM

## 2015-10-17 NOTE — Brief Op Note (Signed)
10/17/2015  12:14 PM  PATIENT:  Bianca Andrews  65 y.o. female  PRE-OPERATIVE DIAGNOSIS:  Spondylolisthesis L 45, stenosis, radiculopathy, lumbago  POST-OPERATIVE DIAGNOSIS: Spondylolisthesis L 45, stenosis, radiculopathy, lumbago   PROCEDURE:  Procedure(s) with comments: L4-5 Maximum access posterior lumbar interbody fusion (N/A) - L4-5 Maximum access posterior lumbar interbody fusion with PEEK cages, pedicle screw fixation, posterolateral arthrodesis  Decompression greater than standard PLIF procedure  SURGEON:  Surgeon(s) and Role:    * Rosemarie Galvis, MD - Primary    * Jeffrey Jenkins, MD - Assisting  PHYSICIAN ASSISTANT:   ASSISTANTS: Poteat, RN   ANESTHESIA:   general  EBL:  Total I/O In: 1000 [I.V.:1000] Out: 375 [Urine:300; Blood:75]  BLOOD ADMINISTERED:none  DRAINS: none   LOCAL MEDICATIONS USED:  MARCAINE    and LIDOCAINE   SPECIMEN:  No Specimen  DISPOSITION OF SPECIMEN:  N/A  COUNTS:  YES  TOURNIQUET:  * No tourniquets in log *  DICTATION: Patient is a 65-year-old with spondylosis , stenosis, spondylolisthesis, disc herniation and severe back and bilateral lower extremity pain at L4/5 levels of the lumbar spine. It was elected to take her to surgery for MASPLIF L 45 level with posterolateral arthrodesis.  Procedure:   Following uncomplicated induction of GETA, and placement of electrodes for neural monitoring, patient was turned into a prone position on the [place] tableand using AP  fluoroscopy the area of planned incision was marked, prepped with betadine scrub and Duraprep, then draped. Exposure was performed of facet joint complex at L 45 level and the MAS retractor was placed.5.0 x 35 mm cortical Nuvasive screws were placed at L 4 bilaterally according to standard landmarks using neural monitoring.  A total laminectomy of L 4 was then performed with disarticulation of facets.  This bone was saved for grafting, combined with allograft after being run  through bone mill and was placed in bone packing device.  Thorough discectomy was performed bilaterally at L 45 and the endplates were prepared for grafting.  Decompression was greater than standard PLIF with wide decompression of all neural elements, including bilateral L 4 and L 5 nerve roots and thecal sac.  23 x 8 x 8 degree cages were placed in the interspace and positioning was confirmed with AP and lateral fluoroscopy.  10 cc of autograft was packed in the interspace medial to the second cage.   Remaining screws were placed at L 5 and 40 mm rods were placed.   And the screws were locked and torqued.Final Xrays showed well positioned implants and screw fixation. The posterolateral region was packed with remaining 10 cc of autograft on each side of midline. Long-acting Marcaine was injected into subcutaneous tissues. The wounds were irrigated and then closed with 1, 2-0 and 3-0 Vicryl stitches. Sterile occlusive dressing was placed with Dermabond. The patient was then extubated in the operating room and taken to recovery in stable and satisfactory condition having tolerated her operation well. Counts were correct at the end of the case.  PLAN OF CARE: Admit to inpatient   PATIENT DISPOSITION:  PACU - hemodynamically stable.   Delay start of Pharmacological VTE agent (>24hrs) due to surgical blood loss or risk of bleeding: yes  

## 2015-10-18 ENCOUNTER — Encounter (HOSPITAL_COMMUNITY): Payer: Self-pay | Admitting: Neurosurgery

## 2015-10-18 MED ORDER — OXYCODONE-ACETAMINOPHEN 5-325 MG PO TABS: 1.0000 | ORAL_TABLET | ORAL | Status: DC | PRN

## 2015-10-18 MED ORDER — TIZANIDINE HCL 2 MG PO TABS: 2.0000 mg | ORAL_TABLET | Freq: Four times a day (QID) | ORAL | Status: DC | PRN

## 2015-10-18 NOTE — Discharge Summary (Signed)
Physician Discharge Summary  Patient ID: Bianca Andrews MRN: [id number] DOB/AGE: 09/21/1950 65 y.o.  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  Following uncomplicated MAS PLIF, she has recovered and progressed nicely.    Consults: None  Significant Diagnostic Studies: radiology: X-Ray: intra-op  Treatments: surgery: L4-5 Maximum access posterior lumbar interbody fusion (N/A) - L4-5 Maximum access posterior lumbar interbody fusion with PEEK cages, pedicle screw fixation, posterolateral arthrodesis   Discharge Exam: Blood pressure 103/55, pulse 81, temperature 98.5 F (36.9 C), temperature source Oral, resp. rate 18, height 4\' 10"  (1.473 m), weight 49.805 kg (109 lb 12.8 oz), SpO2 98 %. Alert, conversant, smiling. No pain sitting, no pain once situated in bed - only with the bending into position. Strength is good BLE. Incision is flat, without erythema or drainage. Honeycomb over Dermabond. Has mobilized nicely since yesterday.    Disposition: Discharge to home.  Pt verbalizes understanding of d/c instructions & already has f/u appt scheduled with DrStern.        Medication List    ASK your doctor about these medications        acetaminophen 500 MG tablet  Commonly known as:  TYLENOL  Take 1,000 mg by mouth 2 (two) times daily as needed for moderate pain.     ADVAIR DISKUS 100-50 MCG/DOSE Aepb  Generic drug:  Fluticasone-Salmeterol   Inhale 1 puff into the lungs daily.     albuterol 108 (90 Base) MCG/ACT inhaler  Commonly known as:  PROVENTIL HFA;VENTOLIN HFA  Inhale 2 puffs into the lungs every 6 (six) hours as needed for wheezing or shortness of breath.     CALCIUM 600+D 600-800 MG-UNIT Tabs  Generic drug:  Calcium Carb-Cholecalciferol  Take 2 tablets by mouth daily.     diclofenac sodium 1 % Gel  Commonly known as:  VOLTAREN  Apply 2 g topically 2 (two) times daily.     multivitamin tablet  Take 1 tablet by mouth daily.     venlafaxine XR 37.5 MG 24 hr capsule  Commonly known as:  EFFEXOR-XR  Take 37.5 mg by mouth daily with breakfast.         Signed: Georgiann Cockeroteat, Jahniah Pallas 10/18/2015, 11:47 AM

## 2015-10-18 NOTE — Progress Notes (Signed)
Subjective: Patient reports "I only have pain when I sit or stand.. but it doesn't last at all"  Objective: Vital signs in last 24 hours: Temp:  [98.2 F (36.8 C)-99.2 F (37.3 C)] 98.5 F (36.9 C) (04/28 0813) Pulse Rate:  [60-99] 81 (04/28 0813) Resp:  [14-20] 18 (04/28 0813) BP: (100-133)/(49-98) 103/55 mmHg (04/28 0813) SpO2:  [93 %-99 %] 98 % (04/28 0813)  Intake/Output from previous day: 04/27 0701 - 04/28 0700 In: 1400 [I.V.:1400] Out: 925 [Urine:850; Blood:75] Intake/Output this shift:    Alert, conversant, smiling. No pain sitting, no pain once situated in bed - only with the bending into position. Strength is good BLE. Incision is flat, without erythema or drainage. Honeycomb over Dermabond. Has mobilized nicely since yesterday.   Lab Results: No results for input(s): WBC, HGB, HCT, PLT in the last 72 hours. BMET No results for input(s): NA, K, CL, CO2, GLUCOSE, BUN, CREATININE, CALCIUM in the last 72 hours.  Studies/Results: Dg Lumbar Spine 2-3 Views  10/17/2015  CLINICAL DATA:  L4-5 fusion EXAM: LUMBAR SPINE - 2-3 VIEW; DG C-ARM 61-120 MIN COMPARISON:  None FLUOROSCOPY TIME:  41 seconds FINDINGS: Three intraoperative fluoroscopic spot images demonstrate interval placement of bilateral pedicle screws at L4-5 with an interbody spacer device. IMPRESSION: Interval placement of bilateral pedicle screws at L4-5 with an interbody spacer device. Electronically Signed   By: Elige KoHetal  Patel   On: 10/17/2015 12:04   Dg C-arm 1-60 Min  10/17/2015  CLINICAL DATA:  L4-5 fusion EXAM: LUMBAR SPINE - 2-3 VIEW; DG C-ARM 61-120 MIN COMPARISON:  None FLUOROSCOPY TIME:  41 seconds FINDINGS: Three intraoperative fluoroscopic spot images demonstrate interval placement of bilateral pedicle screws at L4-5 with an interbody spacer device. IMPRESSION: Interval placement of bilateral pedicle screws at L4-5 with an interbody spacer device. Electronically Signed   By: Elige KoHetal  Patel   On: 10/17/2015 12:04     Assessment/Plan: Improving   LOS: 1 day  Per DrStern, d/c to home. Pt verbalizes understanding of d/c instructions & already has f/u appt scheduled with DrStern.     Georgiann Cockeroteat, Dak Szumski 10/18/2015, 11:42 AM

## 2015-10-18 NOTE — Progress Notes (Signed)
Patient alert and oriented, mae's well, voiding adequate amount of urine, swallowing without difficulty, no c/o pain. Patient discharged home with family. Script and discharged instructions given to patient. Patient and family stated understanding of d/c instructions given and has an appointment with MD. 

## 2015-10-18 NOTE — Evaluation (Signed)
Physical Therapy Evaluation Patient Details Name: Bianca KimCaryn B Wolgamott MRN: [id number] DOB: [birth year] Today's Date: 10/18/2015   History of Present Illness  Pt is a 65 y/o female who presents s/p L4-L5 PLIF on 10/17/15.  Clinical Impression  Patient evaluated by Physical Therapy with no further acute PT needs identified. All education has been completed and the patient has no further questions. At the time of PT eval pt was able to perform transfers and ambulation with gross supervision to modified independence for general safety. See below for any follow-up Physical Therapy or equipment needs. PT is signing off. Thank you for this referral.     Follow Up Recommendations Outpatient PT;Supervision for mobility/OOB    Equipment Recommendations  None recommended by PT    Recommendations for Other Services       Precautions / Restrictions Precautions Precautions: Back Precaution Comments: handout provided and reviewed in detail Required Braces or Orthoses: Spinal Brace Spinal Brace: Lumbar corset;Applied in sitting position Restrictions Weight Bearing Restrictions: No      Mobility  Bed Mobility Overal bed mobility: Needs Assistance Bed Mobility: Rolling;Sidelying to Sit Rolling: Supervision Sidelying to sit: Supervision       General bed mobility comments: in chair on arrival. Reviewed precautions with husband present  Transfers Overall transfer level: Modified independent Equipment used: None             General transfer comment: No assist to power-up to full standing position. VC's for controlled descent into chair.   Ambulation/Gait Ambulation/Gait assistance: Modified independent (Device/Increase time) Ambulation Distance (Feet): 400 Feet Assistive device: None Gait Pattern/deviations: Step-through pattern;Decreased stride length Gait velocity: Decreased Gait velocity interpretation: Below normal speed for age/gender General Gait Details: Somewhat slow but steady.  No unsteadiness or LOB noted.   Stairs Stairs: Yes Stairs assistance: Supervision Stair Management: One rail Right;Forwards;Step to pattern Number of Stairs: 10 General stair comments: No assist required. VC's for sequencing.   Wheelchair Mobility    Modified Rankin (Stroke Patients Only)       Balance Overall balance assessment: No apparent balance deficits (not formally assessed)                                           Pertinent Vitals/Pain Pain Assessment: No/denies pain Faces Pain Scale: Hurts a little bit Pain Location: Back Pain Descriptors / Indicators: Operative site guarding;Sore Pain Intervention(s): Limited activity within patient's tolerance;Monitored during session;Repositioned    Home Living Family/patient expects to be discharged to:: Private residence Living Arrangements: Spouse/significant other Available Help at Discharge: Family;Available PRN/intermittently Type of Home: House Home Access: Stairs to enter   [employer] CorporationEntrance Stairs-Number of Steps: 3 in back, 15 from garage Home Layout: Two level Home Equipment: None Additional Comments: pt ordered elevated toilet seat with handles online and has a hand held shower head she will install at home    Prior Function Level of Independence: Independent               Hand Dominance   Dominant Hand: Right    Extremity/Trunk Assessment   Upper Extremity Assessment: Overall WFL for tasks assessed           Lower Extremity Assessment: Defer to PT evaluation      Cervical / Trunk Assessment: Other exceptions (s/p surg)  Communication   Communication: No difficulties  Cognition Arousal/Alertness: Awake/alert Behavior During Therapy: WFL for tasks assessed/performed Overall  Cognitive Status: Within Functional Limits for tasks assessed                      General Comments General comments (skin integrity, edema, etc.): educated on dressing remains for 3 days and MD  will write order to shower when appropriate    Exercises        Assessment/Plan    PT Assessment Patent does not need any further PT services  PT Diagnosis Difficulty walking;Acute pain   PT Problem List    PT Treatment Interventions     PT Goals (Current goals can be found in the Care Plan section) Acute Rehab PT Goals PT Goal Formulation: All assessment and education complete, DC therapy    Frequency     Barriers to discharge        Co-evaluation               End of Session Equipment Utilized During Treatment: Back brace Activity Tolerance: Patient tolerated treatment well Patient left: in chair;with call bell/phone within reach Nurse Communication: Mobility status         Time: 8657-8469 PT Time Calculation (min) (ACUTE ONLY): 20 min   Charges:   PT Evaluation $PT Eval Moderate Complexity: 1 Procedure     PT G CodesConni Slipper 11-01-15, 9:58 AM   Conni Slipper, PT, DPT Acute Rehabilitation Services Pager: [phone]

## 2015-10-18 NOTE — Discharge Instructions (Signed)

## 2015-10-18 NOTE — Evaluation (Signed)
Occupational Therapy Evaluation Patient Details Name: Bianca Andrews MRN: [id number] DOB: Nov 02, 1950 Today's Date: 10/18/2015    History of Present Illness Pt is a 65 y/o female who presents s/p L4-L5 PLIF on 10/17/15.   Clinical Impression   Patient evaluated by Occupational Therapy with no further acute OT needs identified. All education has been completed and the patient has no further questions. See below for any follow-up Occupational Therapy or equipment needs. OT to sign off. Thank you for referral.       Follow Up Recommendations  No OT follow up    Equipment Recommendations  None recommended by OT    Recommendations for Other Services       Precautions / Restrictions Precautions Precautions: Back Precaution Comments: handout provided and reviewed in detail Required Braces or Orthoses: Spinal Brace Spinal Brace: Lumbar corset;Applied in sitting position Restrictions Weight Bearing Restrictions: No      Mobility Bed Mobility               General bed mobility comments: in chair on arrival. Reviewed precautions with husband present  Transfers Overall transfer level: Modified independent                    Balance                                            ADL Overall ADL's : Modified independent                                       General ADL Comments: educated on  all adls with back precautions, don of brace, environmental setup for home with precautions and handout provided with details. pt advised on how to position pillows for sleeping and holding grandchildren properly. pt with several trips planned and advised to take time to walk with breaks from sitting. pt advised to avoid sitting for long periods of time.      Vision Vision Assessment?: No apparent visual deficits   Perception     Praxis      Pertinent Vitals/Pain Pain Assessment: No/denies pain     Hand Dominance Right    Extremity/Trunk Assessment Upper Extremity Assessment Upper Extremity Assessment: Overall WFL for tasks assessed   Lower Extremity Assessment Lower Extremity Assessment: Defer to PT evaluation   Cervical / Trunk Assessment Cervical / Trunk Assessment: Other exceptions (s/p surg)   Communication Communication Communication: No difficulties   Cognition Arousal/Alertness: Awake/alert Behavior During Therapy: WFL for tasks assessed/performed Overall Cognitive Status: Within Functional Limits for tasks assessed                     General Comments       Exercises       Shoulder Instructions      Home Living Family/patient expects to be discharged to:: Private residence Living Arrangements: Spouse/significant other Available Help at Discharge: Family;Available PRN/intermittently Type of Home: House Home Access: Stairs to enter [employer]Entrance Stairs-Number of Steps: 3 in back, 15 from garage   Home Layout: Two level Alternate Level Stairs-Number of Steps: Flight   Bathroom Shower/Tub: Producer, television/film/videoWalk-in shower   Bathroom Toilet: Standard     Home Equipment: None   Additional Comments: pt ordered elevated toilet seat with handles online and has a  hand held shower head she will install at home      Prior Functioning/Environment Level of Independence: Independent             OT Diagnosis: Acute pain   OT Problem List:     OT Treatment/Interventions:      OT Goals(Current goals can be found in the care plan section) Acute Rehab OT Goals Potential to Achieve Goals: Good  OT Frequency:     Barriers to D/C:            Co-evaluation              End of Session Nurse Communication: Mobility status;Precautions  Activity Tolerance: Patient tolerated treatment well Patient left: in chair;with call bell/phone within reach;with family/visitor present   Time: 1610-9604 OT Time Calculation (min): 35 min Charges:  OT General Charges $OT Visit: 1 Procedure OT  Evaluation $OT Eval Moderate Complexity: 1 Procedure OT Treatments $Self Care/Home Management : 8-22 mins G-Codes:    [name] 11-06-15, 9:50 AM   [name]   OTR/L Pager: [phone] Office: [phone] .

## 2015-12-23 ENCOUNTER — Other Ambulatory Visit: Payer: Self-pay | Admitting: Family Medicine

## 2015-12-23 DIAGNOSIS — Z1231 Encounter for screening mammogram for malignant neoplasm of breast: Secondary | ICD-10-CM

## 2016-01-02 ENCOUNTER — Ambulatory Visit
Admission: RE | Admit: 2016-01-02 | Discharge: 2016-01-02 | Disposition: A | Discharge status: Home / Self Care | Payer: BLUE CROSS/BLUE SHIELD | Source: Ambulatory Visit | Attending: Family Medicine | Admitting: Family Medicine

## 2016-01-02 DIAGNOSIS — Z1231 Encounter for screening mammogram for malignant neoplasm of breast: Secondary | ICD-10-CM

## 2016-12-22 ENCOUNTER — Other Ambulatory Visit: Payer: Self-pay | Admitting: Family Medicine

## 2016-12-22 DIAGNOSIS — Z1231 Encounter for screening mammogram for malignant neoplasm of breast: Secondary | ICD-10-CM

## 2017-01-21 ENCOUNTER — Ambulatory Visit
Admission: RE | Admit: 2017-01-21 | Discharge: 2017-01-21 | Disposition: A | Discharge status: Home / Self Care | Payer: BLUE CROSS/BLUE SHIELD | Source: Ambulatory Visit | Attending: Family Medicine | Admitting: Family Medicine

## 2017-01-21 DIAGNOSIS — Z1231 Encounter for screening mammogram for malignant neoplasm of breast: Secondary | ICD-10-CM

## 2018-03-15 ENCOUNTER — Other Ambulatory Visit: Payer: Self-pay | Admitting: Family Medicine

## 2018-03-15 DIAGNOSIS — Z1231 Encounter for screening mammogram for malignant neoplasm of breast: Secondary | ICD-10-CM

## 2018-04-28 ENCOUNTER — Ambulatory Visit
Admission: RE | Admit: 2018-04-28 | Discharge: 2018-04-28 | Disposition: A | Discharge status: Home / Self Care | Payer: Medicare HMO | Source: Ambulatory Visit | Attending: Family Medicine | Admitting: Family Medicine

## 2018-04-28 DIAGNOSIS — Z1231 Encounter for screening mammogram for malignant neoplasm of breast: Secondary | ICD-10-CM

## 2019-04-18 ENCOUNTER — Other Ambulatory Visit: Payer: Self-pay | Admitting: Family Medicine

## 2019-04-18 DIAGNOSIS — Z1231 Encounter for screening mammogram for malignant neoplasm of breast: Secondary | ICD-10-CM

## 2019-04-19 ENCOUNTER — Other Ambulatory Visit: Payer: Self-pay | Admitting: Family Medicine

## 2019-04-19 DIAGNOSIS — Z1231 Encounter for screening mammogram for malignant neoplasm of breast: Secondary | ICD-10-CM

## 2019-05-08 ENCOUNTER — Other Ambulatory Visit (HOSPITAL_BASED_OUTPATIENT_CLINIC_OR_DEPARTMENT_OTHER): Payer: Self-pay | Admitting: Family Medicine

## 2019-05-08 DIAGNOSIS — Z1231 Encounter for screening mammogram for malignant neoplasm of breast: Secondary | ICD-10-CM

## 2019-05-11 ENCOUNTER — Other Ambulatory Visit: Payer: Self-pay

## 2019-05-11 ENCOUNTER — Ambulatory Visit (HOSPITAL_BASED_OUTPATIENT_CLINIC_OR_DEPARTMENT_OTHER)
Admission: RE | Admit: 2019-05-11 | Discharge: 2019-05-11 | Disposition: A | Discharge status: Home / Self Care | Payer: Medicare HMO | Source: Ambulatory Visit | Attending: Family Medicine | Admitting: Family Medicine

## 2019-05-11 DIAGNOSIS — Z1231 Encounter for screening mammogram for malignant neoplasm of breast: Secondary | ICD-10-CM | POA: Diagnosis not present

## 2019-07-18 ENCOUNTER — Ambulatory Visit: Payer: Medicare HMO

## 2019-07-27 ENCOUNTER — Ambulatory Visit: Payer: Medicare HMO

## 2019-08-08 ENCOUNTER — Ambulatory Visit: Payer: Medicare HMO

## 2019-12-22 IMAGING — MG DIGITAL SCREENING BILAT W/ TOMO W/ CAD
8 series · 9 of 24 positions shown · non-contrast
Comparison: Previous exam(s).

CLINICAL DATA: Screening.

EXAM:
DIGITAL SCREENING BILATERAL MAMMOGRAM WITH TOMO AND CAD

[R CC synth-2D]
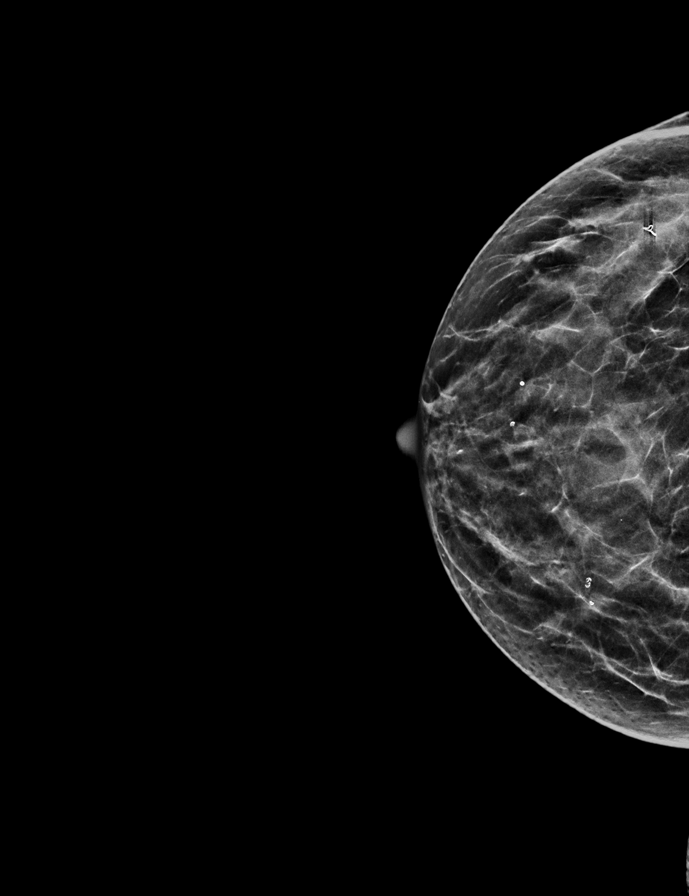

[R MLO synth-2D]
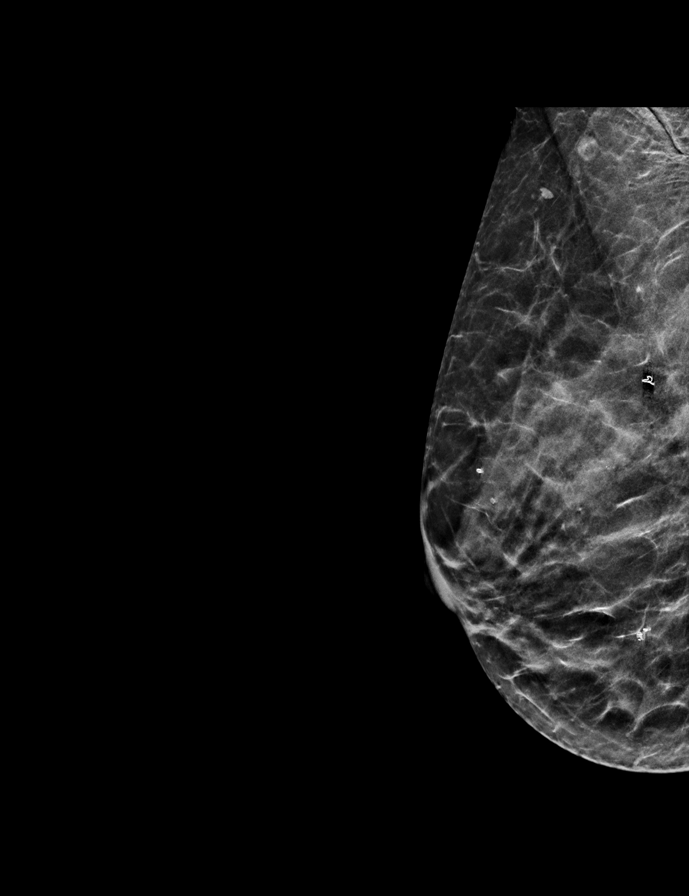

[L CC synth-2D]
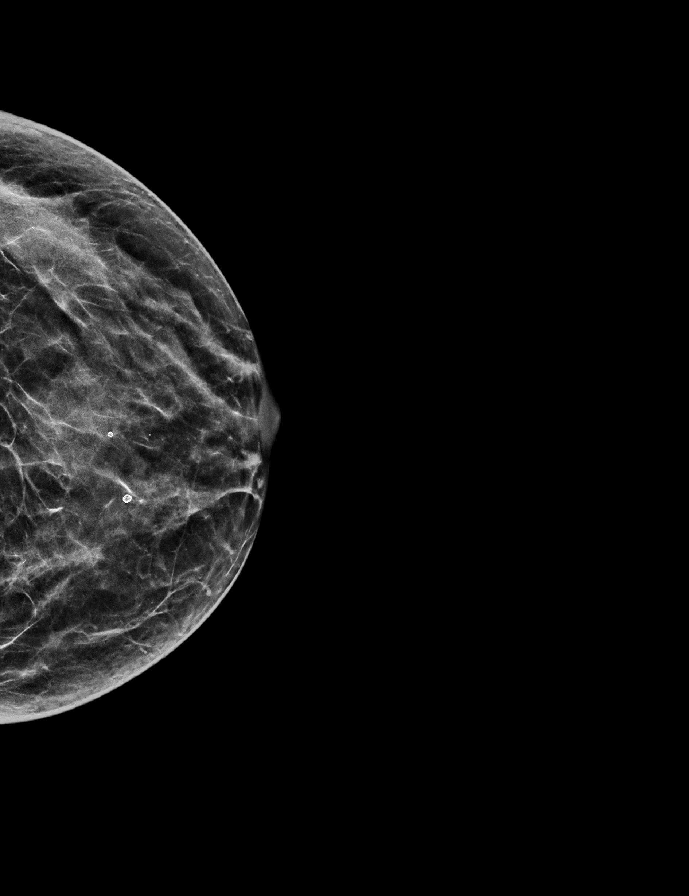

[L MLO synth-2D]
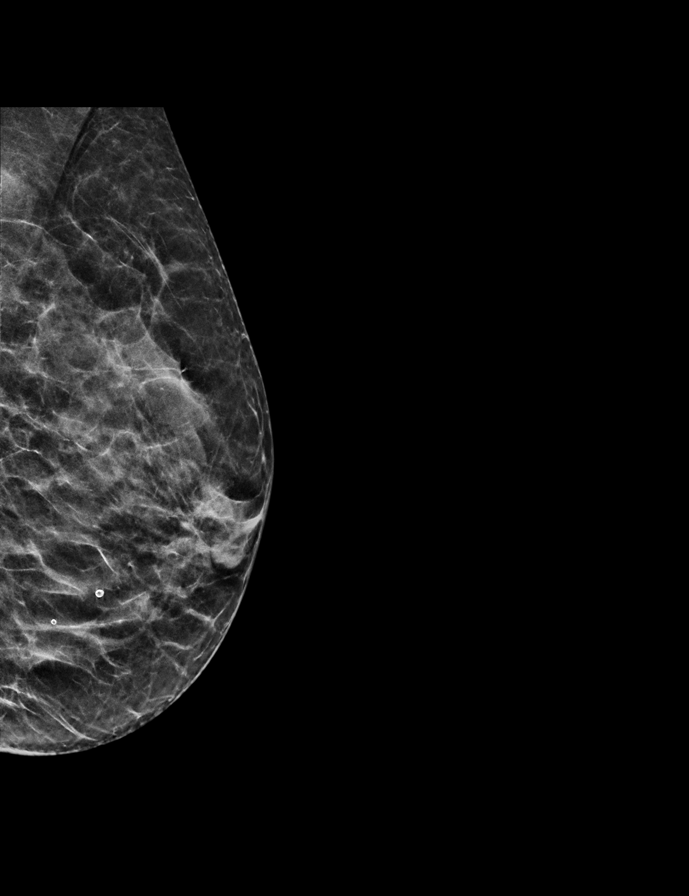

[R CC tomo · 2 of 47 frames shown]
[frame 16/47]
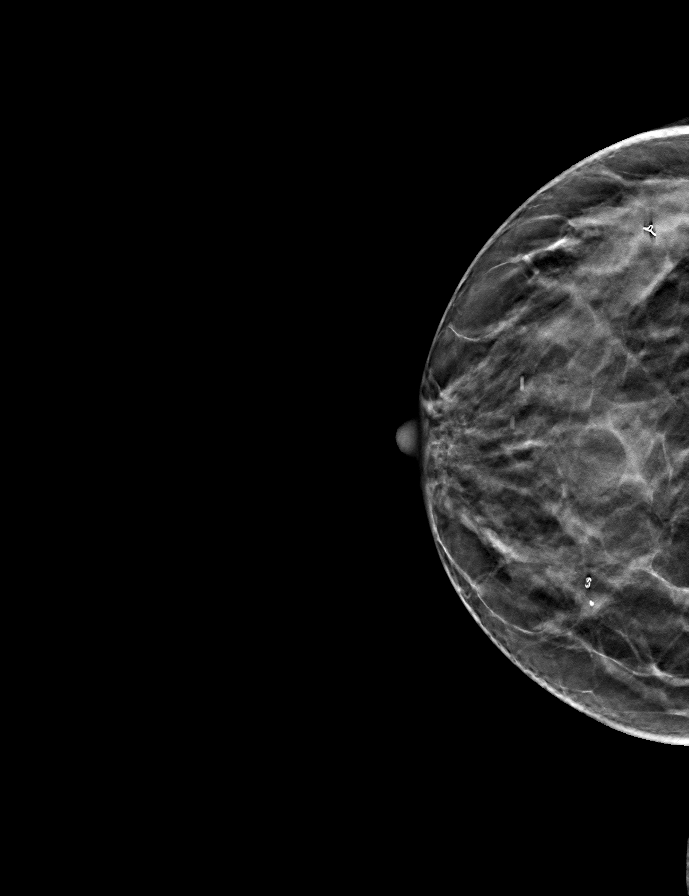
[frame 24/47]
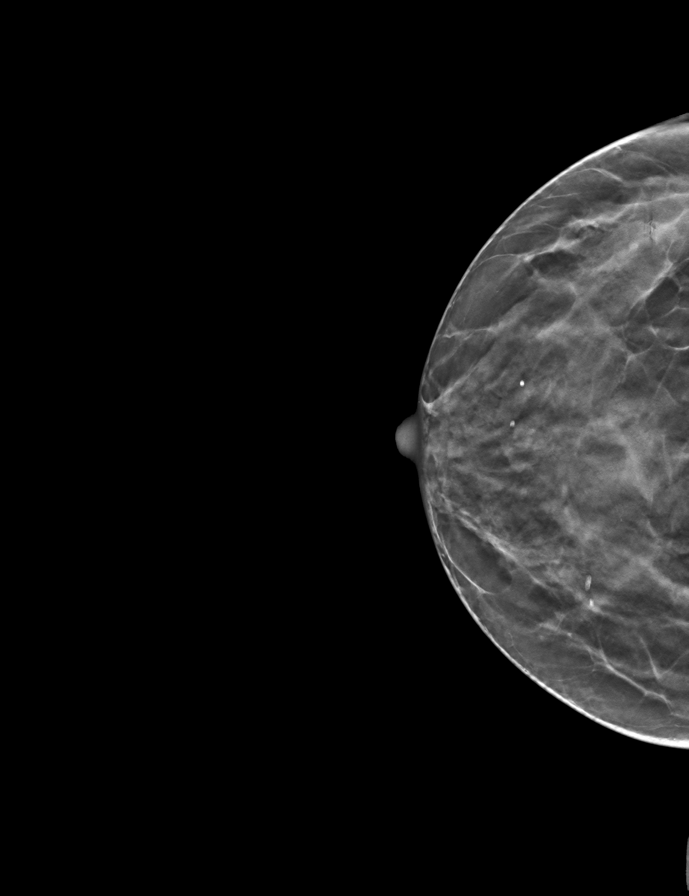

[L MLO tomo · tomo slice 23/46.0]
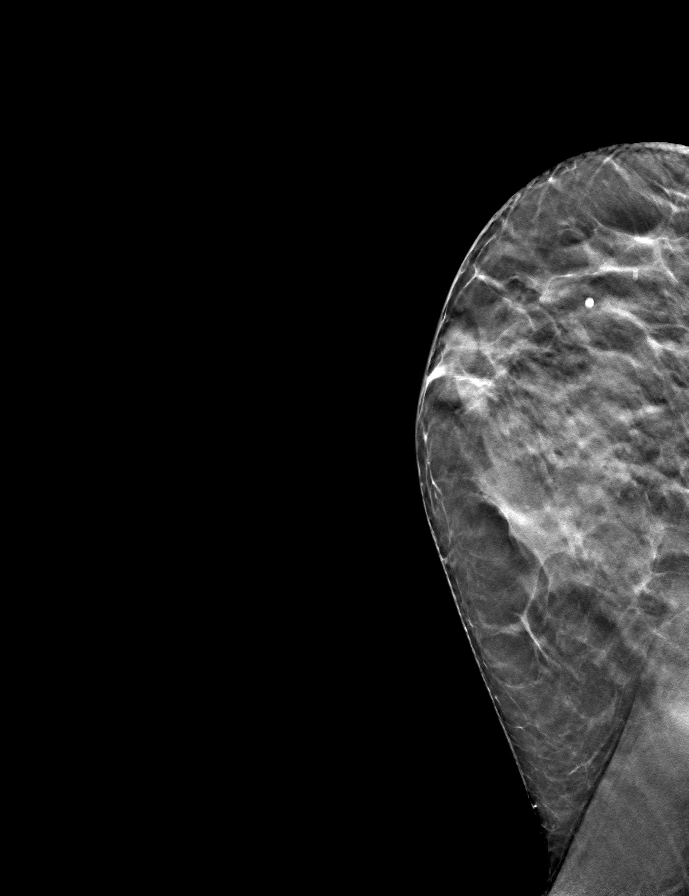

[R MLO tomo · tomo slice 25/48.0]
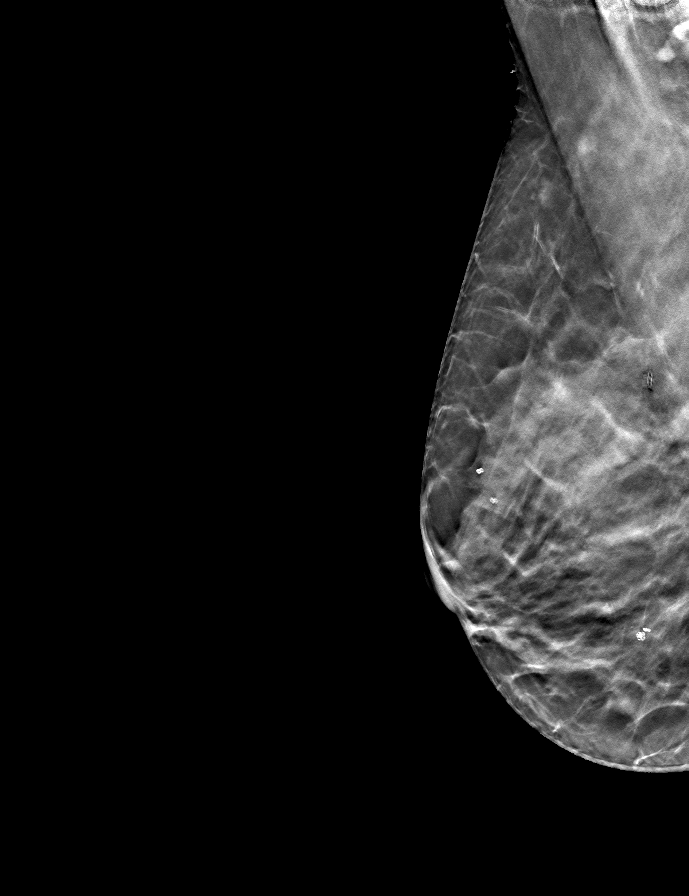

[L CC tomo · tomo slice 25/49.0]
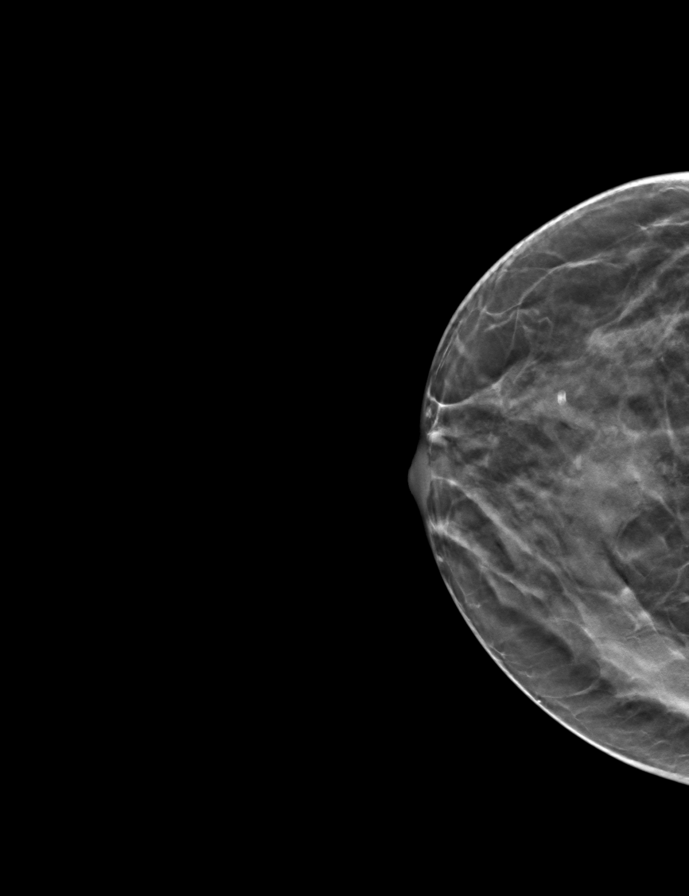

[9 of 24 positions shown; findings below may reference images not displayed]

ACR Breast Density Category c: The breast tissue is heterogeneously
dense, which may obscure small masses.
FINDINGS: There are no findings suspicious for malignancy. Images were
processed with CAD.
IMPRESSION: No mammographic evidence of malignancy. A result letter of this
screening mammogram will be mailed directly to the patient.

RECOMMENDATION:
Screening mammogram in one year. (Code:[id number])

BI-RADS CATEGORY  1: Negative.

## 2022-09-07 LAB — HM COLONOSCOPY

## 2023-06-28 DIAGNOSIS — M810 Age-related osteoporosis without current pathological fracture: Secondary | ICD-10-CM | POA: Diagnosis not present

## 2023-06-28 DIAGNOSIS — Z7189 Other specified counseling: Secondary | ICD-10-CM | POA: Diagnosis not present

## 2023-07-01 ENCOUNTER — Telehealth: Payer: Self-pay

## 2023-07-01 NOTE — Telephone Encounter (Signed)
 Copied from CRM [id number]. Topic: Clinical - Request for Lab/Test Order >> Jul 01, 2023  9:32 AM Curlee DEL wrote: Reason for CRM: Patient of Dr. Grady scheduled her annual physical for later on this month- can she get any lab work done ahead of time? Please advise patient.

## 2023-07-08 ENCOUNTER — Other Ambulatory Visit: Payer: Medicare Other

## 2023-07-12 DIAGNOSIS — D1801 Hemangioma of skin and subcutaneous tissue: Secondary | ICD-10-CM | POA: Diagnosis not present

## 2023-07-12 DIAGNOSIS — Z0189 Encounter for other specified special examinations: Secondary | ICD-10-CM | POA: Diagnosis not present

## 2023-07-12 DIAGNOSIS — E785 Hyperlipidemia, unspecified: Secondary | ICD-10-CM | POA: Diagnosis not present

## 2023-07-12 DIAGNOSIS — L821 Other seborrheic keratosis: Secondary | ICD-10-CM | POA: Diagnosis not present

## 2023-07-12 DIAGNOSIS — L814 Other melanin hyperpigmentation: Secondary | ICD-10-CM | POA: Diagnosis not present

## 2023-07-12 DIAGNOSIS — L820 Inflamed seborrheic keratosis: Secondary | ICD-10-CM | POA: Diagnosis not present

## 2023-07-13 ENCOUNTER — Encounter: Payer: Self-pay | Admitting: Family Medicine

## 2023-07-13 ENCOUNTER — Ambulatory Visit (INDEPENDENT_AMBULATORY_CARE_PROVIDER_SITE_OTHER): Payer: Medicare Other | Admitting: Family Medicine

## 2023-07-13 VITALS — BP 118/72 | HR 82 | Temp 97.9°F | Ht <= 58 in | Wt 110.0 lb

## 2023-07-13 DIAGNOSIS — Z23 Encounter for immunization: Secondary | ICD-10-CM | POA: Diagnosis not present

## 2023-07-13 DIAGNOSIS — E785 Hyperlipidemia, unspecified: Secondary | ICD-10-CM | POA: Insufficient documentation

## 2023-07-13 DIAGNOSIS — Z0001 Encounter for general adult medical examination with abnormal findings: Secondary | ICD-10-CM

## 2023-07-13 DIAGNOSIS — Z0000 Encounter for general adult medical examination without abnormal findings: Secondary | ICD-10-CM

## 2023-07-13 DIAGNOSIS — M810 Age-related osteoporosis without current pathological fracture: Secondary | ICD-10-CM | POA: Diagnosis not present

## 2023-07-13 DIAGNOSIS — H8110 Benign paroxysmal vertigo, unspecified ear: Secondary | ICD-10-CM | POA: Diagnosis not present

## 2023-07-13 MED ORDER — MECLIZINE HCL 12.5 MG PO TABS: 12.5000 mg | ORAL_TABLET | Freq: Three times a day (TID) | ORAL | 1 refills | Status: AC | PRN

## 2023-07-13 NOTE — Progress Notes (Addendum)
Complete physical exam  Assessment & Plan:    Routine Health Maintenance and Physical Exam Discussed health benefits of physical activity, and encouraged her to engage in regular exercise appropriate for her age and condition.  Preventative health care  Need for pneumococcal vaccination -     Pneumococcal Conjugate PCV21(Capvaxive)  Hyperlipidemia, unspecified hyperlipidemia type  Benign paroxysmal positional vertigo, unspecified laterality -     Meclizine HCl; Take 1 tablet (12.5 mg total) by mouth 3 (three) times daily as needed for dizziness.  Dispense: 30 tablet; Refill: 1  Osteoporosis, unspecified osteoporosis type, unspecified pathological fracture presence    Return in 1 year (on 07/12/2024), or if symptoms worsen or fail to improve, for physical.   [facility].  Reviewed the DEXA scan report.  Patient is deciding whether she will do Reclast annual infusion versus Prolia shots every 6 months.  Continue calcium/vitamin D/weightbearing and strength training.  Pneumococcal 21 given today.     Subjective:  Patient ID: Bianca Andrews, female    DOB: [birth year]  Age: 73 y.o. MRN: [id number] Chief Complaint  Patient presents with   Establish Care    Bianca Andrews is a 73 y.o. female who presents today for a complete physical exam. Patient had labs done per [facility]o [facility] with Dr. Edmon Crape and also had lipid panel done at [facility] in [facility].  We have not received those results yet. Osteoporosis-pt will do start with something. Pt is inclined to Prolia shots Hyperlipidemia-patient does not take a statin.  Cholesterol is elevated but HDL is extremely elevated.  Patient does eat healthy and exercises consistently. Benign positional vertigo-patient does know what the exercises are and has been doing them.  Patient states she had some Antivert from 12 years ago and took it and did help.  Patient would like to have a new  prescription for the Antivert.  Patient not having any nausea or vomiting. The ASCVD Risk score (Arnett DK, et al., 2019) failed to calculate for the following reasons:   The valid HDL cholesterol range is 20 to 100 mg/dL   The valid total cholesterol range is 130 to 320 mg/dL  Health Maintenance  Topic Date Due   Hepatitis C Screening  Never done   Colon Cancer Screening  Never done   DEXA scan (bone density measurement)  Never done   Mammogram  05/10/2021   COVID-19 Vaccine (10 - 2024-25 season) 04/29/2023   Medicare Annual Wellness Visit  07/21/2023   DTaP/Tdap/Td vaccine (3 - Td or Tdap) 10/11/2031   Pneumonia Vaccine  Completed   Flu Shot  Completed   Zoster (Shingles) Vaccine  Completed   HPV Vaccine  Aged Out    Most recent fall risk assessment:    07/13/2023    8:33 AM  Fall Risk   Falls in the past year? 0  Number falls in past yr: 0  Injury with Fall? 0     Most recent depression screenings:    07/13/2023    8:33 AM  PHQ 2/9 Scores  PHQ - 2 Score 0       The ASCVD Risk score (Arnett DK, et al., 2019) failed to calculate for the following reasons:   The valid HDL cholesterol range is 20 to 100 mg/dL   The valid total cholesterol range is 130 to 320 mg/dL  Past Surgical History:  Procedure Laterality Date  BREAST BIOPSY Right    benign   DIAGNOSTIC LAPAROSCOPY  1983   MAXIMUM ACCESS (MAS)POSTERIOR LUMBAR INTERBODY FUSION (PLIF) 1 LEVEL N/A 10/17/2015   Procedure: L4-5 Maximum access posterior lumbar interbody fusion;  Surgeon: Maeola Harman, MD;  Location: [facility];  Service: Neurosurgery;  Laterality: N/A;  L4-5 Maximum access posterior lumbar interbody fusion   Social History   Tobacco Use   Smoking status: Never  Vaping Use   Vaping status: Never Used  Substance Use Topics   Alcohol use: Yes    Comment: occasionally   Drug use: No   Social History   Socioeconomic History   Marital status: Married    Spouse name: Not on file   Number of  children: Not on file   Years of education: Not on file   Highest education level: Not on file  Occupational History   Not on file  Tobacco Use   Smoking status: Never   Smokeless tobacco: Not on file  Vaping Use   Vaping status: Never Used  Substance and Sexual Activity   Alcohol use: Yes    Comment: occasionally   Drug use: No   Sexual activity: Not on file  Other Topics Concern   Not on file  Social History Narrative   Not on file   Social Drivers of Health   Financial Resource Strain: Low Risk  (05/12/2021)   Received from [facility] visits prior to 08/22/2022.   Overall Financial Resource Strain (CARDIA)    Difficulty of Paying Living Expenses: Not very hard  Food Insecurity: Low Risk  (06/28/2023)   Received from [facility]   Hunger Vital Sign    Worried About Running Out of Food in the Last Year: Never true    Ran Out of Food in the Last Year: Never true  Transportation Needs: No Transportation Needs (06/28/2023)   Received from [facility]    In the past 12 months, has lack of reliable transportation kept you from medical appointments, meetings, work or from getting things needed for daily living? : No  Physical Activity: Sufficiently Active (05/12/2021)   Received from [facility] visits prior to 08/22/2022.   Exercise Vital Sign    Days of Exercise per Week: 6 days    Minutes of Exercise per Session: 80 min  Stress: No Stress Concern Present (05/12/2021)   Received from [facility] visits prior to 08/22/2022.   [employer] of Occupational Health - Occupational Stress Questionnaire    Feeling of Stress : Not at all  Social Connections: Unknown (07/07/2022)   Received from [facility]   Social Network    Social Network: Not on file  Intimate Partner Violence: Unknown (07/07/2022)   Received from [facility]   HITS    Physically Hurt: Not on file    Insult or Talk Down To:  Not on file    Threaten Physical Harm: Not on file    Scream or Curse: Not on file  Family History  Problem Relation Age of Onset   Basal cell carcinoma Mother   Memory loss Mother   Cancer Father  Colon   Colon cancer Father   Hypertension Brother   Breast cancer Other   Diabetes type II Neg Hx   Glaucoma Neg Hx   Macular degeneration Neg Hx  History reviewed. No pertinent family history. No Known Allergies   Patient Care Team: Bernadette Hoit, MD as  PCP - General (Family Medicine)   Outpatient Medications Prior to Visit  Medication Sig   acetaminophen (TYLENOL) 500 MG tablet Take 1,000 mg by mouth 2 (two) times daily as needed for moderate pain.   albuterol (PROVENTIL HFA;VENTOLIN HFA) 108 (90 Base) MCG/ACT inhaler Inhale 2 puffs into the lungs every 6 (six) hours as needed for wheezing or shortness of breath.    Calcium Carb-Cholecalciferol (CALCIUM 600+D) 600-800 MG-UNIT TABS Take 2 tablets by mouth daily.   magnesium gluconate (MAGONATE) 500 MG tablet Take 500 mg by mouth 2 (two) times daily.   Multiple Vitamin (MULTIVITAMIN) tablet Take 1 tablet by mouth daily.   Potassium Gluconate 595 MG CAPS Take 595 mg by mouth daily.   Fluticasone-Salmeterol (ADVAIR DISKUS) 100-50 MCG/DOSE AEPB Inhale 1 puff into the lungs daily.   oxyCODONE-acetaminophen (PERCOCET/ROXICET) 5-325 MG tablet Take 1-2 tablets by mouth every 4 (four) hours as needed for moderate pain. (Patient not taking: Reported on 07/13/2023)   tiZANidine (ZANAFLEX) 2 MG tablet Take 1 tablet (2 mg total) by mouth every 6 (six) hours as needed for muscle spasms. (Patient not taking: Reported on 07/13/2023)   venlafaxine XR (EFFEXOR-XR) 37.5 MG 24 hr capsule Take 37.5 mg by mouth daily with breakfast.  (Patient not taking: Reported on 07/13/2023)   No facility-administered medications prior to visit.    ROS     Objective:    BP 118/72 (BP Location: Left Arm)   Pulse 82   Temp 97.9 F (36.6 C)   Ht 4\' 10"  (1.473 m)    Wt 110 lb (49.9 kg)   SpO2 99%   BMI 22.99 kg/m  BP Readings from Last 3 Encounters:  07/13/23 118/72  10/18/15 (!) 105/59  10/09/15 130/77   Wt Readings from Last 3 Encounters:  07/13/23 110 lb (49.9 kg)  10/17/15 109 lb 12.8 oz (49.8 kg)  10/09/15 109 lb 12.8 oz (49.8 kg)  General Appearance: Alert, cooperative, no distress, appears stated age.  Head and Neck: Normocephalic, atraumatic. No masses. No thyromegaly. No bruits.  EENT: Eyes: PERRLA, Conjuntiva clear; Ears: bilateral TMs normal; Nose: septum and turbinates unremarkable; Throat: mucus membranes moist. No tonsilar enlargement or exudates. No OP erythema. Cardiovascular: Regular rate and rhythm. No murmurs Respiratory: Lungs clear to auscultation bilaterally, Respiratory effort unremarkable. No wheezes or rhonchi.  Breast: Symmetrical. No masses or lumps. No nipple discharge.  No nipple dimpling. No abnormal axillary nodes Gastrointestinal: Positive bowel sounds. Abdomen soft and non-tender. No organomegaly or masses.  Rebound or guarding. Genitourinary: deferred Musculoskeletal: Gait and station unremarkable.  Back-normal ROM.  Patient is able to get on the exam table without any assistance.  Normal strength and tone.  Extremities :No edema. Peripheral pulses normal Integumentary: Warm and dry.No rashes. No abnormal appearing skin lesions LYMPH NODES: Cervical, supraclavicular, and axillary nodes normal Neurologic: Alert and oriented to place and time. Normal reflexes and symmetric, 5 out of 5 upper extremities and lower extremity strength. Pt could not do tandem gait due to vertigo Skin exam-has multiple pigmented nevi over the chest area back area.    No results found for any visits on 07/13/23. Last CBC Lab Results  Component Value Date   WBC 4.8 10/09/2015   HGB 13.3 10/09/2015   HCT 41.5 10/09/2015   MCV 89.1 10/09/2015   MCH 28.5 10/09/2015   RDW 13.0 10/09/2015   PLT 221 10/09/2015   Last metabolic  panel Last vitamin D No results found for: "25OHVITD2", "25OHVITD3", "VD25OH"  Bernadette Hoit, MD

## 2023-07-15 DIAGNOSIS — Z1231 Encounter for screening mammogram for malignant neoplasm of breast: Secondary | ICD-10-CM | POA: Diagnosis not present

## 2023-08-30 DIAGNOSIS — M810 Age-related osteoporosis without current pathological fracture: Secondary | ICD-10-CM | POA: Diagnosis not present

## 2023-09-02 DIAGNOSIS — H25013 Cortical age-related cataract, bilateral: Secondary | ICD-10-CM | POA: Diagnosis not present

## 2023-09-02 DIAGNOSIS — H5203 Hypermetropia, bilateral: Secondary | ICD-10-CM | POA: Diagnosis not present

## 2023-09-02 DIAGNOSIS — H2513 Age-related nuclear cataract, bilateral: Secondary | ICD-10-CM | POA: Diagnosis not present

## 2023-09-02 DIAGNOSIS — H35363 Drusen (degenerative) of macula, bilateral: Secondary | ICD-10-CM | POA: Diagnosis not present

## 2023-09-02 DIAGNOSIS — H3554 Dystrophies primarily involving the retinal pigment epithelium: Secondary | ICD-10-CM | POA: Diagnosis not present

## 2023-09-02 DIAGNOSIS — H25041 Posterior subcapsular polar age-related cataract, right eye: Secondary | ICD-10-CM | POA: Diagnosis not present

## 2023-09-02 DIAGNOSIS — H524 Presbyopia: Secondary | ICD-10-CM | POA: Diagnosis not present

## 2023-09-02 DIAGNOSIS — H353131 Nonexudative age-related macular degeneration, bilateral, early dry stage: Secondary | ICD-10-CM | POA: Diagnosis not present

## 2023-09-02 DIAGNOSIS — H43393 Other vitreous opacities, bilateral: Secondary | ICD-10-CM | POA: Diagnosis not present

## 2023-09-02 DIAGNOSIS — H52203 Unspecified astigmatism, bilateral: Secondary | ICD-10-CM | POA: Diagnosis not present

## 2023-09-22 ENCOUNTER — Other Ambulatory Visit: Payer: Self-pay | Admitting: Family Medicine

## 2023-09-22 NOTE — Telephone Encounter (Signed)
 Copied from CRM [id number]. Topic: Clinical - Medication Refill >> Sep 22, 2023  4:15 PM Geroge Baseman wrote: Most Recent Primary Care Visit:  Provider: Bernadette Hoit  Department: [facility]  Visit Type: NEW PT - OFFICE VISIT  Date: 07/13/2023  Medication: Fluticasone-Salmeterol (ADVAIR DISKUS) 100-50 MCG/DOSE AEPB  Has the patient contacted their pharmacy? Yes (Agent: If no, request that the patient contact the pharmacy for the refill. If patient does not wish to contact the pharmacy document the reason why and proceed with request.) (Agent: If yes, when and what did the pharmacy advise?)  Is this the correct pharmacy for this prescription? Yes If no, delete pharmacy and type the correct one.  This is the patient's preferred pharmacy:  [facility]/ [address] in [place]. Fax: [phone]     Has the prescription been filled recently? No  Is the patient out of the medication? No, running low  Has the patient been seen for an appointment in the last year OR does the patient have an upcoming appointment? Yes  Can we respond through MyChart? Yes  Agent: Please be advised that Rx refills may take up to 3 business days. We ask that you follow-up with your pharmacy.

## 2023-09-22 NOTE — Telephone Encounter (Signed)
 States that the medication cannot be pended for refill.

## 2023-09-23 ENCOUNTER — Encounter: Payer: Self-pay | Admitting: Family Medicine

## 2023-09-23 ENCOUNTER — Other Ambulatory Visit: Payer: Self-pay

## 2023-09-23 MED ORDER — FLUTICASONE-SALMETEROL 100-50 MCG/ACT IN AEPB: 1.0000 | INHALATION_SPRAY | Freq: Two times a day (BID) | RESPIRATORY_TRACT | 1 refills | Status: DC

## 2023-09-24 NOTE — Telephone Encounter (Signed)
 Duplicate request, Rx refilled 09/23/23 already. Requested Prescriptions  Pending Prescriptions Disp Refills   fluticasone-salmeterol (ADVAIR) 100-50 MCG/ACT AEPB 1 each 3    Sig: Inhale 1 puff into the lungs 2 (two) times daily.     Pulmonology:  Combination Products Failed - 09/24/2023  9:15 AM      Failed - Valid encounter within last 12 months    Recent Outpatient Visits           2 months ago Preventative health care   [facility] Bernadette Hoit, MD

## 2023-10-08 DIAGNOSIS — Z7189 Other specified counseling: Secondary | ICD-10-CM | POA: Diagnosis not present

## 2023-10-08 DIAGNOSIS — T458X5A Adverse effect of other primarily systemic and hematological agents, initial encounter: Secondary | ICD-10-CM | POA: Diagnosis not present

## 2023-10-08 DIAGNOSIS — M810 Age-related osteoporosis without current pathological fracture: Secondary | ICD-10-CM | POA: Diagnosis not present

## 2023-10-19 DIAGNOSIS — L820 Inflamed seborrheic keratosis: Secondary | ICD-10-CM | POA: Diagnosis not present

## 2023-11-02 ENCOUNTER — Other Ambulatory Visit: Payer: Self-pay | Admitting: Family Medicine

## 2023-11-03 NOTE — Telephone Encounter (Signed)
 Rx 09/23/23 60 each 1RF- too soon Requested Prescriptions  Pending Prescriptions Disp Refills   WIXELA INHUB 100-50 MCG/ACT AEPB [Pharmacy Med Name: WIXELA INHUB DISKUS 100/50MCG 60S] 60 each 1    Sig: INHALE 1 PUFF INTO THE LUNGS TWICE DAILY     Pulmonology:  Combination Products Failed - 11/03/2023  3:52 PM      Failed - Valid encounter within last 12 months    Recent Outpatient Visits           3 months ago Preventative health care   [facility] Amadeo June, MD

## 2023-12-29 ENCOUNTER — Other Ambulatory Visit: Payer: Self-pay

## 2023-12-29 ENCOUNTER — Telehealth: Payer: Self-pay | Admitting: Family Medicine

## 2023-12-29 MED ORDER — FLUTICASONE-SALMETEROL 100-50 MCG/ACT IN AEPB
1.0000 | INHALATION_SPRAY | Freq: Two times a day (BID) | RESPIRATORY_TRACT | 1 refills | Status: DC
Start: 2023-12-29 — End: 2024-02-04

## 2023-12-29 NOTE — Telephone Encounter (Unsigned)
 Copied from CRM [id number]. Topic: Clinical - Medication Refill >> Dec 29, 2023 12:00 PM Precious C wrote: Medication: Fluticasone -Salmeterol (ADVAIR DISKUS) 100-50 MCG/DOSE AEPB (Expired)   Has the patient contacted their pharmacy? Yes  (Agent: If yes, when and what did the pharmacy advise?) Pharmacy stated no rx refills available  This is the patient's preferred pharmacy:   [id number] - [place], [place] - [address] AT [facility] [address] [place] [address] Phone: [phone] Fax: [phone]  Is this the correct pharmacy for this prescription? Yes If no, delete pharmacy and type the correct one.   Has the prescription been filled recently? No  Is the patient out of the medication? No,will be in 2 week and has to go OOT  Has the patient been seen for an appointment in the last year OR does the patient have an upcoming appointment? Yes  Can we respond through MyChart? Yes  Agent: Please be advised that Rx refills may take up to 3 business days. We ask that you follow-up with your pharmacy.

## 2023-12-29 NOTE — Telephone Encounter (Signed)
 Sent in medication

## 2024-01-10 ENCOUNTER — Ambulatory Visit: Payer: Self-pay

## 2024-01-10 ENCOUNTER — Encounter: Payer: Self-pay | Admitting: Family Medicine

## 2024-01-10 ENCOUNTER — Ambulatory Visit: Admitting: Family Medicine

## 2024-01-10 VITALS — BP 130/76 | HR 94 | Temp 98.0°F | Ht <= 58 in | Wt 109.6 lb

## 2024-01-10 DIAGNOSIS — S8992XA Unspecified injury of left lower leg, initial encounter: Secondary | ICD-10-CM | POA: Diagnosis not present

## 2024-01-10 MED ORDER — SULFAMETHOXAZOLE-TRIMETHOPRIM 800-160 MG PO TABS: 1.0000 | ORAL_TABLET | Freq: Two times a day (BID) | ORAL | 0 refills | Status: DC

## 2024-01-10 NOTE — Progress Notes (Signed)
 Subjective:  HPI: Bianca Andrews is a 73 y.o. female presenting on 01/10/2024 for Acute Visit (Laceration on left leg. Red and causing pain / )   HPI Patient is in today for a laceration to her left shin that occurred 9 days ago. She cut her leg on a metal stool. Has been cleaning with hydrogen peroxide and applying antibacterial ointment however the wound remains red and continues to drain moderate amounts of bloody, yellow, clear drainage. She denies fever, chills, body aches, nausea, vomiting, malaise. Tetanus vaccine is up to date.  Review of Systems  All other systems reviewed and are negative.   Relevant past medical history reviewed and updated as indicated.   Past Medical History:  Diagnosis Date   Asthma    has not had asthma attack in a couple of years   Hyperlipidemia    does not take medication   PONV (postoperative nausea and vomiting)    slow to wake up     Past Surgical History:  Procedure Laterality Date   BREAST BIOPSY Right    benign   DIAGNOSTIC LAPAROSCOPY  1983   MAXIMUM ACCESS (MAS)POSTERIOR LUMBAR INTERBODY FUSION (PLIF) 1 LEVEL N/A 10/17/2015   Procedure: L4-5 Maximum access posterior lumbar interbody fusion;  Surgeon: Fairy Levels, MD;  Location: [facility];  Service: Neurosurgery;  Laterality: N/A;  L4-5 Maximum access posterior lumbar interbody fusion    Allergies and medications reviewed and updated.   Current Outpatient Medications:    acetaminophen  (TYLENOL ) 500 MG tablet, Take 1,000 mg by mouth 2 (two) times daily as needed for moderate pain., Disp: , Rfl:    albuterol  (PROVENTIL  HFA;VENTOLIN  HFA) 108 (90 Base) MCG/ACT inhaler, Inhale 2 puffs into the lungs every 6 (six) hours as needed for wheezing or shortness of breath. , Disp: , Rfl:    Calcium  Carb-Cholecalciferol  (CALCIUM  600+D) 600-800 MG-UNIT TABS, Take 2 tablets by mouth daily., Disp: , Rfl:    fluticasone  (FLONASE) 50 MCG/ACT nasal spray, Place 2 sprays into the nose daily., Disp: ,  Rfl:    Fluticasone -Salmeterol (ADVAIR DISKUS) 100-50 MCG/DOSE AEPB, Inhale 1 puff into the lungs daily., Disp: , Rfl:    magnesium gluconate (MAGONATE) 500 MG tablet, Take 500 mg by mouth 2 (two) times daily., Disp: , Rfl:    meclizine  (ANTIVERT ) 12.5 MG tablet, Take 1 tablet (12.5 mg total) by mouth 3 (three) times daily as needed for dizziness., Disp: 30 tablet, Rfl: 1   Multiple Vitamin (MULTIVITAMIN) tablet, Take 1 tablet by mouth daily., Disp: , Rfl:    oxyCODONE -acetaminophen  (PERCOCET/ROXICET) 5-325 MG tablet, Take 1-2 tablets by mouth every 4 (four) hours as needed for moderate pain., Disp: 60 tablet, Rfl: 0   sulfamethoxazole -trimethoprim  (BACTRIM  DS) 800-160 MG tablet, Take 1 tablet by mouth 2 (two) times daily., Disp: 14 tablet, Rfl: 0   fluticasone -salmeterol (ADVAIR DISKUS) 100-50 MCG/ACT AEPB, Inhale 1 puff into the lungs 2 (two) times daily. (Patient not taking: Reported on 01/10/2024), Disp: 60 each, Rfl: 1   Potassium Gluconate 595 MG CAPS, Take 595 mg by mouth daily. (Patient not taking: Reported on 01/10/2024), Disp: , Rfl:    tiZANidine  (ZANAFLEX ) 2 MG tablet, Take 1 tablet (2 mg total) by mouth every 6 (six) hours as needed for muscle spasms. (Patient not taking: Reported on 01/10/2024), Disp: 60 tablet, Rfl: 1   venlafaxine  XR (EFFEXOR -XR) 37.5 MG 24 hr capsule, Take 37.5 mg by mouth daily with breakfast.  (Patient not taking: Reported on 01/10/2024), Disp: , Rfl:  No Known Allergies  Objective:   BP 130/76   Pulse 94   Temp 98 F (36.7 C)   Ht 4' 10 (1.473 m)   Wt 109 lb 9.6 oz (49.7 kg)   SpO2 98%   BMI 22.91 kg/m      01/10/2024   11:53 AM 07/13/2023    8:36 AM 10/18/2015   12:13 PM  Vitals with BMI  Height 4' 10 4' 10   Weight 109 lbs 10 oz 110 lbs   BMI 22.91 23   Systolic 130 118 894  Diastolic 76 72 59  Pulse 94 82 67     Physical Exam Vitals and nursing note reviewed.  Constitutional:      Appearance: Normal appearance. She is normal weight.   HENT:     Head: Normocephalic and atraumatic.  Skin:    General: Skin is warm and dry.     Findings: Erythema and laceration present.         Comments: 1cm x 0.2cm laceration with surrounding erythema. Minimal drainage observed.  Neurological:     General: No focal deficit present.     Mental Status: She is alert and oriented to person, place, and time. Mental status is at baseline.  Psychiatric:        Mood and Affect: Mood normal.        Behavior: Behavior normal.        Thought Content: Thought content normal.        Judgment: Judgment normal.     Assessment & Plan:  Injury of left shin, initial encounter Assessment & Plan: Wound bed contains granulation tissue and is beginning to epithelialize over the open area. There is surrounding erythema and she reports what sounds like serosanguinous drainage. There is some slough covering the wound bed. She is traveling soon to the beach. Will start Bactrim  DS BID x7 days. Advised to keep area clean and dry and seek medical care for worsening redness, purulent drainage, warmth, swelling, fever, chills, body aches.    Other orders -     Sulfamethoxazole -Trimethoprim ; Take 1 tablet by mouth 2 (two) times daily.  Dispense: 14 tablet; Refill: 0     Follow up plan: Return if symptoms worsen or fail to improve.  Jeoffrey GORMAN Barrio, FNP

## 2024-01-10 NOTE — Telephone Encounter (Signed)
 FYI Only or Action Required?: FYI only for provider.  Patient was last seen in primary care on 07/13/2023 by Aletha Bene, MD.  Called Nurse Triage reporting Laceration.  Symptoms began a week ago.  Interventions attempted: OTC medications: ABX ointment.  Symptoms are: stable.  Triage Disposition: Home Care  Patient/caregiver understands and will follow disposition?: Yes - Pt is going to the beach this weekend and wants provider to see wound for possible treatment.                Copied from CRM [id number]. Topic: Clinical - Red Word Triage >> Jan 10, 2024  9:34 AM Essie A wrote: Red Word that prompted transfer to Nurse Triage: Cut leg about 2 weeks ago on a metal stool, oozing, red, 3/4 of an inch, swelling and painful. Reason for Disposition  Minor cut or scratch  Answer Assessment - Initial Assessment Questions 1. APPEARANCE of INJURY: What does the injury look like?      3/4 inch x 1/8 wide - oozing reddish 2. ONSET: How long ago did the injury occur?      2 weeks ago 3. LOCATION: Where is the injury located?      Left shin 4. SIZE: How large is the cut?      3/4 inch x 1/8 wide  5. BLEEDING: Is it bleeding now? If Yes, ask: Is it difficult to stop?      Bleed  6. PAIN: Is there any pain? If Yes, ask: How bad is the pain? (Scale 0-10; or none, mild, moderate, severe)     yes 7. MECHANISM: Tell me how it happened.      slipped 8. TETANUS: When was your last tetanus booster?     Up to date.  Protocols used: Cuts and Lacerations-A-AH

## 2024-01-10 NOTE — Assessment & Plan Note (Signed)
 Wound bed contains granulation tissue and is beginning to epithelialize over the open area. There is surrounding erythema and she reports what sounds like serosanguinous drainage. There is some slough covering the wound bed. She is traveling soon to the beach. Will start Bactrim  DS BID x7 days. Advised to keep area clean and dry and seek medical care for worsening redness, purulent drainage, warmth, swelling, fever, chills, body aches.

## 2024-01-11 ENCOUNTER — Encounter: Payer: Self-pay | Admitting: Family Medicine

## 2024-01-11 ENCOUNTER — Ambulatory Visit
Admission: RE | Admit: 2024-01-11 | Discharge: 2024-01-11 | Disposition: A | Discharge status: Home / Self Care | Attending: Physician Assistant | Admitting: Physician Assistant

## 2024-01-11 ENCOUNTER — Other Ambulatory Visit: Payer: Self-pay

## 2024-01-11 ENCOUNTER — Ambulatory Visit (INDEPENDENT_AMBULATORY_CARE_PROVIDER_SITE_OTHER): Admitting: Radiology

## 2024-01-11 ENCOUNTER — Ambulatory Visit: Admitting: Radiology

## 2024-01-11 VITALS — BP 122/78 | HR 97 | Temp 97.9°F | Resp 18

## 2024-01-11 DIAGNOSIS — S8992XA Unspecified injury of left lower leg, initial encounter: Secondary | ICD-10-CM | POA: Diagnosis not present

## 2024-01-11 DIAGNOSIS — M79662 Pain in left lower leg: Secondary | ICD-10-CM | POA: Diagnosis not present

## 2024-01-11 DIAGNOSIS — S81802D Unspecified open wound, left lower leg, subsequent encounter: Secondary | ICD-10-CM

## 2024-01-11 MED ORDER — SILVER SULFADIAZINE 1 % EX CREA: 1.0000 | TOPICAL_CREAM | Freq: Two times a day (BID) | CUTANEOUS | 0 refills | Status: AC

## 2024-01-11 NOTE — ED Triage Notes (Signed)
 Pt presents with complaints of left leg injury after falling and hitting left leg on stool two weeks ago. Went to primary care yesterday and NP washed the wound out. Started on antibiotics. Pt states she could not sleep due to the pain experienced last night. Currently rates overall leg pain a 6/10. One dose of the antibiotic taken + Ibuprofen with improvement. No imaging completed on left leg.

## 2024-01-11 NOTE — Discharge Instructions (Addendum)
 You were seen today for concerns of a wound to your left lower leg Your imaging did not show evidence of a fracture or osseous injury but I am still waiting on radiology to provide the final interpretation. We will contact you if there are any concerning findings and you can see the results in MyChart Please continue your antibiotic as directed.  Keep the area clean with warm water and gentle soap then pat to dry. You do not need to apply harsh cleansers like peroxide or alcohol as these can cause further skin irritation.  I have sent in a cream for you to apply to the are twice per day to aid in antibacterial coverage and recovery.  You can continue to alternate Tylenol  and ibuprofen as needed for pain management. If you notice any of the following please return here or go to the emergency room for further evaluation: Severe swelling of the leg, drainage that looks like pus, redness or warmth to the area that is not resolving as you continue your antibiotic course, fever or chills, difficulty moving your leg

## 2024-01-11 NOTE — ED Provider Notes (Signed)
 [facility]    CSN: [id number] Arrival date & time: 01/11/24  0815      History   Chief Complaint Chief Complaint  Patient presents with   Leg Injury    Injury almost 2 weeks ago, went to the dr  yesterday.  should've had stitches initially. thought it would be healed by now. Leg is swollen and skin is really tight and painful. I could not sleep last night, having trouble walking without  pain. - Entered by patient    HPI Bianca Andrews is a 73 y.o. female.   HPI Pt presents today for concerns for left lower leg injury that occurred almost 2 weeks ago  She reports she tried cleaning the area initially and closing it with butterflies but she has developed persistent pain and tightness along the area  She reports the pain has become so intense that she had trouble sleeping last night She reports she did have some drainage which is mostly clear and redish in nature She saw her PCP yesterday who attempted to clean out the wound and started her on Bactrim  po BID x 7 days    Past Medical History:  Diagnosis Date   Asthma    has not had asthma attack in a couple of years   Hyperlipidemia    does not take medication   PONV (postoperative nausea and vomiting)    slow to wake up    Patient Active Problem List   Diagnosis Date Noted   Injury of left shin 01/10/2024   Need for pneumococcal vaccination 07/13/2023   Hyperlipidemia 07/13/2023   Benign positional vertigo 07/13/2023   Osteoporosis 07/13/2023   Spondylolisthesis of lumbar region 10/17/2015   Low back pain 12/19/2013   Preventative health care 11/30/2013   Asthma 11/30/2013    Past Surgical History:  Procedure Laterality Date   BREAST BIOPSY Right    benign   DIAGNOSTIC LAPAROSCOPY  1983   MAXIMUM ACCESS (MAS)POSTERIOR LUMBAR INTERBODY FUSION (PLIF) 1 LEVEL N/A 10/17/2015   Procedure: L4-5 Maximum access posterior lumbar interbody fusion;  Surgeon: Fairy Levels, MD;  Location: [facility];  Service:  Neurosurgery;  Laterality: N/A;  L4-5 Maximum access posterior lumbar interbody fusion    OB History   No obstetric history on file.      Home Medications    Prior to Admission medications   Medication Sig Start Date End Date Taking? Authorizing Provider  silver  sulfADIAZINE  (SILVADENE ) 1 % cream Apply 1 Application topically 2 (two) times daily for 7 days. 01/11/24 01/18/24 Yes Pandora Mccrackin E, PA-C  acetaminophen  (TYLENOL ) 500 MG tablet Take 1,000 mg by mouth 2 (two) times daily as needed for moderate pain.    [provider]  albuterol  (PROVENTIL  HFA;VENTOLIN  HFA) 108 (90 Base) MCG/ACT inhaler Inhale 2 puffs into the lungs every 6 (six) hours as needed for wheezing or shortness of breath.  12/25/14   [provider]  Calcium  Carb-Cholecalciferol  (CALCIUM  600+D) 600-800 MG-UNIT TABS Take 2 tablets by mouth daily.    [provider]  fluticasone  (FLONASE) 50 MCG/ACT nasal spray Place 2 sprays into the nose daily. 05/01/21   [provider]  fluticasone -salmeterol (ADVAIR DISKUS) 100-50 MCG/ACT AEPB Inhale 1 puff into the lungs 2 (two) times daily. Patient not taking: Reported on 01/10/2024 12/29/23   Aletha Bene, MD  Fluticasone -Salmeterol (ADVAIR DISKUS) 100-50 MCG/DOSE AEPB Inhale 1 puff into the lungs daily. 04/10/15 01/10/24  [provider]  magnesium gluconate (MAGONATE) 500 MG tablet  Take 500 mg by mouth 2 (two) times daily.    [provider]  meclizine  (ANTIVERT ) 12.5 MG tablet Take 1 tablet (12.5 mg total) by mouth 3 (three) times daily as needed for dizziness. 07/13/23   Aguiar, Rafaela, MD  Multiple Vitamin (MULTIVITAMIN) tablet Take 1 tablet by mouth daily.    [provider]  oxyCODONE -acetaminophen  (PERCOCET/ROXICET) 5-325 MG tablet Take 1-2 tablets by mouth every 4 (four) hours as needed for moderate pain. 10/18/15   Unice Pac, MD  Potassium Gluconate 595 MG CAPS Take 595 mg by mouth daily. Patient not taking:  Reported on 01/10/2024    [provider]  sulfamethoxazole -trimethoprim  (BACTRIM  DS) 800-160 MG tablet Take 1 tablet by mouth 2 (two) times daily. 01/10/24   Kayla Jeoffrey RAMAN, FNP  tiZANidine  (ZANAFLEX ) 2 MG tablet Take 1 tablet (2 mg total) by mouth every 6 (six) hours as needed for muscle spasms. Patient not taking: Reported on 01/10/2024 10/18/15   Unice Pac, MD  venlafaxine  XR (EFFEXOR -XR) 37.5 MG 24 hr capsule Take 37.5 mg by mouth daily with breakfast.  Patient not taking: Reported on 01/10/2024 07/23/15   [provider]    Family History History reviewed. No pertinent family history.  Social History Social History   Tobacco Use   Smoking status: Never   Smokeless tobacco: Never  Vaping Use   Vaping status: Never Used  Substance Use Topics   Alcohol use: Yes    Comment: occasionally   Drug use: No     Allergies   Patient has no known allergies.   Review of Systems Review of Systems  Skin:  Positive for wound.     Physical Exam Triage Vital Signs ED Triage Vitals  Encounter Vitals Group     BP 01/11/24 0833 122/78     Girls Systolic BP Percentile --      Girls Diastolic BP Percentile --      Boys Systolic BP Percentile --      Boys Diastolic BP Percentile --      Pulse Rate 01/11/24 0833 97     Resp 01/11/24 0833 18     Temp 01/11/24 0833 97.9 F (36.6 C)     Temp Source 01/11/24 0833 Oral     SpO2 01/11/24 0833 96 %     Weight --      Height --      Head Circumference --      Peak Flow --      Pain Score 01/11/24 0831 6     Pain Loc --      Pain Education --      Exclude from Growth Chart --    No data found.  Updated Vital Signs BP 122/78 (BP Location: Right Arm)   Pulse 97   Temp 97.9 F (36.6 C) (Oral)   Resp 18   SpO2 96%   Visual Acuity Right Eye Distance:   Left Eye Distance:   Bilateral Distance:    Right Eye Near:   Left Eye Near:    Bilateral Near:     Physical Exam Vitals reviewed.  Constitutional:       General: She is awake. She is not in acute distress.    Appearance: Normal appearance. She is well-developed and well-groomed. She is not ill-appearing or toxic-appearing.  HENT:     Head: Normocephalic and atraumatic.  Pulmonary:     Effort: Pulmonary effort is normal.  Musculoskeletal:     Cervical back: Normal range of  motion.  Skin:    General: Skin is warm and dry.     Findings: Wound present.      Neurological:     Mental Status: She is alert.  Psychiatric:        Behavior: Behavior is cooperative.      Media Information   Document Information  Photos  Left leg wound  01/11/2024 08:56  Attached To:  Hospital Encounter on 01/11/24 with [facility] PROVIDER  Source Information  Sephora Boyar, Rocky BRAVO, PA-C  [facility]     Media Information   Document Information  Photos  Left leg wound  01/11/2024 08:57  Attached To:  Hospital Encounter on 01/11/24 with [facility] PROVIDER  Source Information  Lark Runk, Rocky BRAVO, PA-C  [facility]    UC Treatments / Results  Labs (all labs ordered are listed, but only abnormal results are displayed) Labs Reviewed - No data to display  EKG   Radiology DG Tibia/Fibula Left Result Date: 01/11/2024 CLINICAL DATA:  Blunt trauma. EXAM: LEFT TIBIA AND FIBULA - 2 VIEW COMPARISON:  None Available. FINDINGS: No fracture of the tibia or fibula. Knee joint and ankle joint appear normal on two views. IMPRESSION: No fracture or dislocation. Electronically Signed   By: Jackquline Boxer M.D.   On: 01/11/2024 09:42    Procedures Procedures (including critical care time)  Medications Ordered in UC Medications - No data to display  Initial Impression / Assessment and Plan / UC Course  I have reviewed the triage vital signs and the nursing notes.  Pertinent labs & imaging results that were available during my care of the patient were reviewed by me and considered in my medical decision making (see chart for details).       Final Clinical Impressions(s) / UC Diagnoses   Final diagnoses:  Pain in left lower leg  Open wound of left lower leg, subsequent encounter   Pt presents today with concerns for left lower leg wound that has been present for about 2 weeks following a blunt injury. She reports that she tried to cleanse it at home and close with butterfly bandages but this did not seem to provide improvement. She was seen by her PCP yesterday- they attempted wound cleansing and started her on Bactrim  PO BID x 7 days. She is concerned for lingering pain and leg tightness and would like to rule out underlying fracture as she reports there was significant bruising when she first sustained the injury and she has a previous hx of osteoporosis. Still awaiting imaging review by radiology but no obvious signs of osseous injury on my interpretation. Will review home care measures, recommend she finishes entire course of abx therapy and will send in script for Silvadene  cream to aid with recovery. ED and return precautions reviewed and provided in AVS. Follow up as needed for persistent or progressing symptoms      Discharge Instructions      You were seen today for concerns of a wound to your left lower leg Your imaging did not show evidence of a fracture or osseous injury but I am still waiting on radiology to provide the final interpretation. We will contact you if there are any concerning findings and you can see the results in MyChart Please continue your antibiotic as directed.  Keep the area clean with warm water and gentle soap then pat to dry. You do not need to apply harsh cleansers like peroxide or alcohol as these can cause further skin irritation.  I have sent in a cream for you to apply to the are twice per day to aid in antibacterial coverage and recovery.  You can continue to alternate Tylenol  and ibuprofen as needed for pain management. If you notice any of the following please return here or go to the  emergency room for further evaluation: Severe swelling of the leg, drainage that looks like pus, redness or warmth to the area that is not resolving as you continue your antibiotic course, fever or chills, difficulty moving your leg      ED Prescriptions     Medication Sig Dispense Auth. Provider   silver  sulfADIAZINE  (SILVADENE ) 1 % cream Apply 1 Application topically 2 (two) times daily for 7 days. 25 g Shanti Eichel E, PA-C      PDMP not reviewed this encounter.   Marylene Rocky BRAVO, PA-C 01/11/24 [id number]

## 2024-01-13 ENCOUNTER — Ambulatory Visit: Payer: Self-pay | Admitting: Family Medicine

## 2024-01-13 NOTE — Telephone Encounter (Signed)
 FYI Only or Action Required?: FYI only for provider.  Patient was last seen in primary care on 01/10/2024 by Kayla Jeoffrey RAMAN, FNP.  Called Nurse Triage reporting Leg Swelling.  Symptoms began several weeks ago.  Interventions attempted: Prescription medications: sulfamethoxazole -trimethoprim  (BACTRIM  DS) 800-160 MG tablet, silver  sulfADIAZINE  (SILVADENE ) 1 % cream.  Symptoms are: gradually worsening.  Triage Disposition: See Physician Within 24 Hours  Patient/caregiver understands and will follow disposition?: Yes                  Copied from CRM [id number]. Topic: Clinical - Red Word Triage >> Jan 13, 2024  9:00 AM Ivette P wrote: [facility] that prompted transfer to Nurse Triage: gave antibiotic and not getting better.  left [place]e leg - [place] fr[place] night. was told is not infected. big time infected. went to UC after visit. leg is swollen. having 102 fever. whole lower leg is swollen and hot to touch Reason for Disposition  [1] MODERATE leg swelling (e.g., swelling extends up to knees) AND [2] new-onset or getting worse  Answer Assessment - Initial Assessment Questions Pt is leaving town tomorrow night. Pt states she has taken 3 days of abx and has 7 days left. Pt is not sure if she needs an additional abx. Pt scheduled for an appointment tomorrow in office. This RN educated pt on new-worsening symptoms and when to call back/seek emergent care. Pt verbalized understanding and agrees to plan.    1. ONSET: When did the swelling start? (e.g., minutes, hours, days)     Pt saw Jeoffrey Kayla, NP, last Monday and wound was cleaned- spike 102 F fever Monday night; pt went to UC on Tuesday; pt was prescribed antibiotics by Jeoffrey Kayla, NP 2. LOCATION: What part of the leg is swollen?  Are both legs swollen or just one leg?     Left leg after cut on leg that happened 2 weeks ago 3. SEVERITY: How bad is the swelling? (e.g., localized; mild, moderate, severe)     Knee  down, not tremendous swelling 4. REDNESS: Is there redness or signs of infection?     Slightly pink, injury looks worse, pt thinks the redness is expanding 5. PAIN: Is the swelling painful to touch? If Yes, ask: How painful is it?   (Scale 1-10; mild, moderate or severe)     Pain loosens up with walking, sometimes feels like there is a rubber band around injury site, not preventing me from doing anything, pain is getting a little better 6. FEVER: Do you have a fever? If Yes, ask: What is it, how was it measured, and when did it start?      No fever currently 10. OTHER SYMPTOMS: Do you have any other symptoms? (e.g., chest pain, difficulty breathing)       No  Protocols used: Leg Swelling and Edema-A-AH

## 2024-01-14 ENCOUNTER — Ambulatory Visit: Admitting: Family Medicine

## 2024-01-14 ENCOUNTER — Encounter: Payer: Self-pay | Admitting: Family Medicine

## 2024-01-14 VITALS — BP 110/56 | HR 102 | Temp 98.3°F | Ht <= 58 in | Wt 107.0 lb

## 2024-01-14 DIAGNOSIS — L03116 Cellulitis of left lower limb: Secondary | ICD-10-CM | POA: Diagnosis not present

## 2024-01-14 MED ORDER — CEPHALEXIN 500 MG PO CAPS: 500.0000 mg | ORAL_CAPSULE | Freq: Four times a day (QID) | ORAL | 0 refills | Status: DC

## 2024-01-14 NOTE — Progress Notes (Signed)
 Subjective:    Patient ID: Bianca Andrews, female    DOB: 24-Jul-1950, 73 y.o.   MRN: [id number]  HPI  About a week ago, the patient suffered a laceration to her anterior left shin.  This is roughly the diameter of a nickel.  Now around this is erythematous warm and tender.  Erythema encompasses an area of 10 cm.  Skin is warm to the touch and tender.  Patient is currently on Bactrim  for the last 3 days to cover gram-negative's and MRSA.  She feels like it is getting worse Past Medical History:  Diagnosis Date   Asthma    has not had asthma attack in a couple of years   Hyperlipidemia    does not take medication   PONV (postoperative nausea and vomiting)    slow to wake up   Past Surgical History:  Procedure Laterality Date   BREAST BIOPSY Right    benign   DIAGNOSTIC LAPAROSCOPY  1983   MAXIMUM ACCESS (MAS)POSTERIOR LUMBAR INTERBODY FUSION (PLIF) 1 LEVEL N/A 10/17/2015   Procedure: L4-5 Maximum access posterior lumbar interbody fusion;  Surgeon: Fairy Levels, MD;  Location: [facility];  Service: Neurosurgery;  Laterality: N/A;  L4-5 Maximum access posterior lumbar interbody fusion   Current Outpatient Medications on File Prior to Visit  Medication Sig Dispense Refill   acetaminophen  (TYLENOL ) 500 MG tablet Take 1,000 mg by mouth 2 (two) times daily as needed for moderate pain.     albuterol  (PROVENTIL  HFA;VENTOLIN  HFA) 108 (90 Base) MCG/ACT inhaler Inhale 2 puffs into the lungs every 6 (six) hours as needed for wheezing or shortness of breath.      Calcium  Carb-Cholecalciferol  (CALCIUM  600+D) 600-800 MG-UNIT TABS Take 2 tablets by mouth daily.     fluticasone  (FLONASE) 50 MCG/ACT nasal spray Place 2 sprays into the nose daily.     fluticasone -salmeterol (ADVAIR DISKUS) 100-50 MCG/ACT AEPB Inhale 1 puff into the lungs 2 (two) times daily. (Patient not taking: Reported on 01/10/2024) 60 each 1   Fluticasone -Salmeterol (ADVAIR DISKUS) 100-50 MCG/DOSE AEPB Inhale 1 puff into the lungs  daily.     magnesium gluconate (MAGONATE) 500 MG tablet Take 500 mg by mouth 2 (two) times daily.     meclizine  (ANTIVERT ) 12.5 MG tablet Take 1 tablet (12.5 mg total) by mouth 3 (three) times daily as needed for dizziness. 30 tablet 1   Multiple Vitamin (MULTIVITAMIN) tablet Take 1 tablet by mouth daily.     oxyCODONE -acetaminophen  (PERCOCET/ROXICET) 5-325 MG tablet Take 1-2 tablets by mouth every 4 (four) hours as needed for moderate pain. 60 tablet 0   Potassium Gluconate 595 MG CAPS Take 595 mg by mouth daily. (Patient not taking: Reported on 01/10/2024)     silver  sulfADIAZINE  (SILVADENE ) 1 % cream Apply 1 Application topically 2 (two) times daily for 7 days. 25 g 0   sulfamethoxazole -trimethoprim  (BACTRIM  DS) 800-160 MG tablet Take 1 tablet by mouth 2 (two) times daily. 14 tablet 0   tiZANidine  (ZANAFLEX ) 2 MG tablet Take 1 tablet (2 mg total) by mouth every 6 (six) hours as needed for muscle spasms. (Patient not taking: Reported on 01/10/2024) 60 tablet 1   venlafaxine  XR (EFFEXOR -XR) 37.5 MG 24 hr capsule Take 37.5 mg by mouth daily with breakfast.  (Patient not taking: Reported on 01/10/2024)     No current facility-administered medications on file prior to visit.   No Known Allergies Social History   Socioeconomic History   Marital status: Married  Spouse name: Not on file   Number of children: Not on file   Years of education: Not on file   Highest education level: Not on file  Occupational History   Not on file  Tobacco Use   Smoking status: Never   Smokeless tobacco: Never  Vaping Use   Vaping status: Never Used  Substance and Sexual Activity   Alcohol use: Yes    Comment: occasionally   Drug use: No   Sexual activity: Not on file  Other Topics Concern   Not on file  Social History Narrative   Not on file   Social Drivers of Health   Financial Resource Strain: Low Risk  (05/12/2021)   Received from [facility] visits prior to 08/22/2022.    Overall Financial Resource Strain (CARDIA)    Difficulty of Paying Living Expenses: Not very hard  Food Insecurity: Low Risk  (06/28/2023)   Received from [facility]   Hunger Vital Sign    Within the past 12 months, you worried that your food would run out before you got money to buy more: Never true    Within the past 12 months, the food you bought just didn't last and you didn't have money to get more. : Never true  Transportation Needs: No Transportation Needs (06/28/2023)   Received from [facility]    In the past 12 months, has lack of reliable transportation kept you from medical appointments, meetings, work or from getting things needed for daily living? : No  Physical Activity: Sufficiently Active (05/12/2021)   Received from [facility] visits prior to 08/22/2022.   Exercise Vital Sign    On average, how many days per week do you engage in moderate to strenuous exercise (like a brisk walk)?: 6 days    On average, how many minutes do you engage in exercise at this level?: 80 min  Stress: No Stress Concern Present (05/12/2021)   Received from [facility] visits prior to 08/22/2022.   [employer] of Occupational [employer] - Occupational Stress Questionnaire    Feeling of Stress : Not at all  Social Connections: Unknown (07/07/2022)   Received from [facility]   Social Network    Social Network: Not on file  Intimate Partner Violence: Unknown (07/07/2022)   Received from [facility]   HITS    Physically Hurt: Not on file    Insult or Talk Down To: Not on file    Threaten Physical Harm: Not on file    Scream or Curse: Not on file     Review of Systems  All other systems reviewed and are negative.      Objective:   Physical Exam Vitals reviewed.  Constitutional:      Appearance: She is normal weight.  Cardiovascular:     Rate and Rhythm: Normal rate and regular rhythm.     Heart sounds: Normal heart  sounds.  Pulmonary:     Effort: Pulmonary effort is normal.     Breath sounds: Normal breath sounds.  Musculoskeletal:        General: Swelling, tenderness and signs of injury present.       Legs:  Skin:    Findings: Erythema present.  Neurological:     Mental Status: She is alert.           Assessment & Plan:   Cellulitis of left lower extremity Continue Bactrim  to cover MRSA but  add Keflex 500 mg 4 times daily for 10 days to cover gram-positive skin flora.  If worsening, go to the emergency room for possible IV antibiotics

## 2024-01-24 ENCOUNTER — Other Ambulatory Visit: Payer: Self-pay

## 2024-01-24 MED ORDER — CEPHALEXIN 500 MG PO CAPS: 500.0000 mg | ORAL_CAPSULE | Freq: Four times a day (QID) | ORAL | 0 refills | Status: DC

## 2024-02-04 ENCOUNTER — Ambulatory Visit: Admitting: Family Medicine

## 2024-02-04 ENCOUNTER — Encounter: Payer: Self-pay | Admitting: Family Medicine

## 2024-02-04 VITALS — BP 122/82 | HR 89 | Temp 97.9°F | Ht <= 58 in | Wt 111.1 lb

## 2024-02-04 DIAGNOSIS — L03116 Cellulitis of left lower limb: Secondary | ICD-10-CM | POA: Diagnosis not present

## 2024-02-04 NOTE — Progress Notes (Signed)
 Patient Office Visit  Assessment & Plan:  Cellulitis of left lower extremity   Assessment and Plan    Cellulitis of lower limb Cellulitis improved by 80%. Treated with Cefalexin and Bactrim. No diabetes, reducing gram-negative infection risk. Concern for C. difficile colitis with prolonged antibiotics. - no more oral antibiotics for now.  - Apply Bactroban cream topically. - Monitor for improvement over next month. - Avoid increased physical activity.     She will let us  know if she does not continue to improve. Will hold off on oral antibiotics at this time.      No follow-ups on file.   Subjective:    Patient ID: Bianca Andrews, female    DOB: 1950-07-16  Age: 73 y.o. MRN: [id number]  Chief Complaint  Patient presents with   Wound Check    Wound Check   Discussed the use of AI scribe software for clinical note transcription with the patient, who gave verbal consent to proceed.  History of Present Illness   Bianca Andrews is a 73 year old female who presents with a persistent leg infection since mid July.  She has a persistent infection in her left leg that has shown improvement but is not fully resolved. The infection initially drained clear fluid, with the last drainage noted about ten days ago. There has been no pus. She describes the infection as having gone from 'bad to worse to better, but it's not gone.'  she thinks it's about 80% better. The area was achy this morning, particularly while standing at the gym.  She has been on multiple courses of antibiotics, including Cefalexin four times a day for 20 days and Bactrim for seven days. She completed the antibiotics recently, with the last dose taken yesterday. She mentions that the infection is about 'eighty percent better' but still not completely resolved. Bactroban cream was provided during an urgent care visit in July but did not use it.   The infection began after an injury that occurred after hours, and she  did not seek immediate care. She initially saw a healthcare provider ten days after the injury. During the course of the infection, she experienced a fever of 102F and sought care at an urgent care facility where she was given bactroban after getting Septra.   She maintains an active lifestyle, participating in [employer] classes and walking a couple of miles daily. She recently returned from a two-week beach trip where she continued her physical activities. She notes that the infection has been uncomfortable, especially with increased activity.  No current fever, chills, or pus. She has been cautious about her gut health due to the extensive antibiotic use.     Originally the area was about 10 cm in diameter. She has UTD Tetanus shot 2023.     The ASCVD Risk score (Arnett DK, et al., 2019) failed to calculate for the following reasons:   The valid HDL cholesterol range is 20 to 100 mg/dL   The valid total cholesterol range is 130 to 320 mg/dL  Past Medical History:  Diagnosis Date   Asthma    has not had asthma attack in a couple of years   Hyperlipidemia    does not take medication   PONV (postoperative nausea and vomiting)    slow to wake up   Past Surgical History:  Procedure Laterality Date   BREAST BIOPSY Right    benign   DIAGNOSTIC LAPAROSCOPY  1983   MAXIMUM ACCESS (MAS)POSTERIOR LUMBAR  INTERBODY FUSION (PLIF) 1 LEVEL N/A 10/17/2015   Procedure: L4-5 Maximum access posterior lumbar interbody fusion;  Surgeon: Fairy Levels, MD;  Location: [facility];  Service: Neurosurgery;  Laterality: N/A;  L4-5 Maximum access posterior lumbar interbody fusion   Social History   Tobacco Use   Smoking status: Never   Smokeless tobacco: Never  Vaping Use   Vaping status: Never Used  Substance Use Topics   Alcohol use: Yes    Comment: occasionally   Drug use: No   History reviewed. No pertinent family history. No Known Allergies  ROS    Objective:    BP 122/82   Pulse  89   Temp 97.9 F (36.6 C)   Ht 4' 10 (1.473 m)   Wt 111 lb 2 oz (50.4 kg)   SpO2 99%   BMI 23.23 kg/m  BP Readings from Last 3 Encounters:  02/04/24 122/82  01/14/24 (!) 110/56  01/11/24 122/78   Wt Readings from Last 3 Encounters:  02/04/24 111 lb 2 oz (50.4 kg)  01/14/24 107 lb (48.5 kg)  01/10/24 109 lb 9.6 oz (49.7 kg)    Physical Exam Vitals and nursing note reviewed.  Constitutional:      General: She is not in acute distress.    Appearance: Normal appearance.  HENT:     Head: Normocephalic.  Eyes:     Pupils: Pupils are equal, round, and reactive to light.  Musculoskeletal:     Right lower leg: No edema.     Left lower leg: No edema.  Skin:    Findings: Erythema and lesion present.     Comments: Left lower leg- 3 cm by 3.5 cm indurated erythematous lesion with eschar in the middle. No drainage/purulence from the area. No streaking noted. No tenderness noted, not warm to the touch. Patient is able to bear weight without difficulty   Neurological:     General: No focal deficit present.     Mental Status: She is alert and oriented to person, place, and time.  Psychiatric:        Mood and Affect: Mood normal.        Behavior: Behavior normal.      No results found for any visits on 02/04/24.

## 2024-02-25 ENCOUNTER — Telehealth: Payer: Self-pay

## 2024-02-25 ENCOUNTER — Other Ambulatory Visit: Payer: Self-pay

## 2024-02-25 MED ORDER — COVID-19 MRNA VAC-TRIS(PFIZER) 30 MCG/0.3ML IM SUSY
0.3000 mL | PREFILLED_SYRINGE | Freq: Once | INTRAMUSCULAR | 0 refills | Status: AC
Start: 2024-02-25 — End: 2024-02-25

## 2024-02-25 NOTE — Telephone Encounter (Signed)
 Copied from CRM [id number]. Topic: Clinical - Medication Question >> Feb 25, 2024 10:47 AM Charlet HERO wrote: Reason for CRM: Patient is calling to get asst with having a script sent for covid vaccine sent to [facility] [address] in [place]. She is stating that she can get it done within the hour. Please call the patient when it has been sent.

## 2024-05-01 ENCOUNTER — Encounter: Payer: Self-pay | Admitting: Family Medicine

## 2024-05-02 ENCOUNTER — Other Ambulatory Visit: Payer: Self-pay

## 2024-05-02 MED ORDER — FLUTICASONE-SALMETEROL 100-50 MCG/ACT IN AEPB: 1.0000 | INHALATION_SPRAY | Freq: Two times a day (BID) | RESPIRATORY_TRACT | 3 refills | Status: AC

## 2024-07-18 ENCOUNTER — Encounter: Payer: Self-pay | Admitting: Family Medicine

## 2024-07-18 ENCOUNTER — Ambulatory Visit: Admitting: Family Medicine

## 2024-07-18 VITALS — BP 120/70 | HR 85 | Ht <= 58 in | Wt 111.1 lb

## 2024-07-18 DIAGNOSIS — M810 Age-related osteoporosis without current pathological fracture: Secondary | ICD-10-CM | POA: Diagnosis not present

## 2024-07-18 DIAGNOSIS — N952 Postmenopausal atrophic vaginitis: Secondary | ICD-10-CM | POA: Diagnosis not present

## 2024-07-18 DIAGNOSIS — Z1231 Encounter for screening mammogram for malignant neoplasm of breast: Secondary | ICD-10-CM | POA: Diagnosis not present

## 2024-07-18 DIAGNOSIS — J4520 Mild intermittent asthma, uncomplicated: Secondary | ICD-10-CM

## 2024-07-18 DIAGNOSIS — E785 Hyperlipidemia, unspecified: Secondary | ICD-10-CM

## 2024-07-18 DIAGNOSIS — Z0001 Encounter for general adult medical examination with abnormal findings: Secondary | ICD-10-CM | POA: Diagnosis not present

## 2024-07-18 DIAGNOSIS — Z0000 Encounter for general adult medical examination without abnormal findings: Secondary | ICD-10-CM

## 2024-07-18 NOTE — Progress Notes (Signed)
 "  Complete physical exam  Assessment & Plan:    Routine Health Maintenance and Physical Exam Discussed health benefits of physical activity, and encouraged her to engage in regular exercise appropriate for her age and condition.  Preventative health care -     CBC with Differential/Platelet -     Comprehensive metabolic panel with GFR -     Lipid panel -     TSH -     VITAMIN D  25 Hydroxy (Vit-D Deficiency, Fractures)  Screening mammogram for breast cancer -     3D Screening Mammogram, Left and Right; Future  Osteoporosis, unspecified osteoporosis type, unspecified pathological fracture presence  Atrophic vaginitis  Hyperlipidemia, unspecified hyperlipidemia type -     CT CARDIAC SCORING (SELF PAY ONLY); Future  Mild intermittent asthma, unspecified whether complicated   Test results were reviewed and analyzed as part of the medical decision making of this visit.  Reviewed notes from [facility] from Dr. Midge and previous DEXA scan.  Recommend healthy diet i.e mediterranean/DASH diet, consistent exercise - 30 minutes 5 day per week Calcium  score ordered in [employer]. Mammogram due now in [place]- order placed to be done at [facility] imaging Return in about 6 months (around 01/15/2025), or if symptoms worsen or fail to improve.        Subjective:  Patient ID: Bianca Andrews, female    DOB: 1950-09-18  Age: 74 y.o. MRN: [id number] Chief Complaint  Patient presents with   Annual Exam    Bianca Andrews is a 74 y.o. female who presents today for a complete physical exam. Colonoscopy-UTD March 2024 per [facility], needs repeat in 5 years per [facility] DEXA scan-UTD Tetanus-UTD Shingrix UTD History of Present Illness Bianca Andrews is a 74 year old female who presents for a routine follow-up and discussion of recent health concerns.  She received her COVID and flu vaccinations on September 5th, 2025. She is due for a mammogram and believes her last one was at [facility] in  2025. She believes she is up to date on her shingles vaccine and reports having bone density checks every two years.  In March 2025, she experienced flu-like symptoms, including a low-grade fever, stiff neck, sore wrist, and heart palpitations, following a Reclast  infusion. Her insurance covered the infusion despite initial concerns about coverage. Patient will get next infusion in March   She experiences vaginal dryness and dyspareunia. Patient using lubricant but was wondering about hormone creams.  Asthma- mild intermittent- she uses Wixela at half dose and has noted changes in cost with her insurance. She utilizes GoodRx to reduce medication expenses.  She is participating in a norovirus vaccine study, which involves a weekly app survey and follow-up visits, for which she receives compensation.  She is physically active, working out three days a week, walking her dogs, and participating in cardio workouts and chair yoga at the [facility] [address]. 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  She treated a corn on her foot with Dr. Heriberto corn removal, which has improved. She also has a new skin lesion that she plans to have evaluated by a dermatologist.  Physical Exam CHEST: Lungs clear to auscultation bilaterally. NEUROLOGICAL: Sensory exam intact.  Assessment and Plan Adult Wellness Visit Routine wellness visit with emphasis on screenings and vaccinations. Mammogram due today. Bone density screening up to date. Discussed calcium  score CT scan for cardiovascular risk assessment due to high cholesterol. - Ordered mammogram at [facility]. -  Ordered blood work for anemia, kidney function, cholesterol, thyroid , and vitamin D . - Recommended calcium  score CT scan at [facility] in [facility]   Osteoporosis Managed with Reclast  infusions. Previous infusion caused flu-like symptoms. Discussed potential for less severe reactions with future infusions. Insurance covers Reclast , Prolia not  covered. - Continue Reclast  infusions as scheduled.  Postmenopausal atrophic vaginitis Causing vaginal dryness and painful intercourse. Discussed estrogen cream but advised she will need concurrent progesterone/prometrium.  States it is not affecting her quality of life at this time.  Prefers to avoid hormone therapy. Discussed non-hormonal options. - Consider non-hormonal lubricants like Astroglide for symptom relief.  Hyperlipidemia High cholesterol levels. Discussed calcium  score CT scan for cardiovascular risk assessment. She is open to the CT scan despite it being self-pay. - Ordered calcium  score CT scan at [facility]. - Continue current management and monitor cholesterol levels. The ASCVD Risk score (Arnett DK, et al., 2019) failed to calculate for the following reasons:   The valid HDL cholesterol range is 20 to 100 mg/dL   The valid total cholesterol range is 130 to 320 mg/dL   * - Cholesterol units were assumed  Health Maintenance  Topic Date Due   Hepatitis C Screening  Never done   Osteoporosis screening with Bone Density Scan  Never done   Breast Cancer Screening  05/10/2021   Medicare Annual Wellness Visit  07/21/2023   COVID-19 Vaccine (10 - 2025-26 season) 08/03/2024*   DTaP/Tdap/Td vaccine (3 - Td or Tdap) 10/11/2031   Colon Cancer Screening  09/06/2032   Pneumococcal Vaccine for age over 53  Completed   Flu Shot  Completed   Zoster (Shingles) Vaccine  Completed   Meningitis B Vaccine  Aged Out  *Topic was postponed. The date shown is not the original due date.    Most recent fall risk assessment:    07/13/2023    8:33 AM  Fall Risk   Falls in the past year? 0  Number falls in past yr: 0  Injury with Fall? 0      Data saved with a previous flowsheet row definition     Most recent depression screenings:    07/18/2024   12:42 PM 07/13/2023    8:33 AM  PHQ 2/9 Scores  PHQ - 2 Score 0 0      The ASCVD Risk score (Arnett DK, et al., 2019) failed to  calculate for the following reasons:   The valid HDL cholesterol range is 20 to 100 mg/dL   The valid total cholesterol range is 130 to 320 mg/dL   * - Cholesterol units were assumed  Past Surgical History:  Procedure Laterality Date   BREAST BIOPSY Right    benign   DIAGNOSTIC LAPAROSCOPY  1983   MAXIMUM ACCESS (MAS)POSTERIOR LUMBAR INTERBODY FUSION (PLIF) 1 LEVEL N/A 10/17/2015   Procedure: L4-5 Maximum access posterior lumbar interbody fusion;  Surgeon: Fairy Levels, MD;  Location: [facility];  Service: Neurosurgery;  Laterality: N/A;  L4-5 Maximum access posterior lumbar interbody fusion   Social History[1] Social History   Socioeconomic History   Marital status: Married    Spouse name: Not on file   Number of children: Not on file   Years of education: Not on file   Highest education level: Bachelor's degree (e.g., BA, AB, BS)  Occupational History   Not on file  Tobacco Use   Smoking status: Never   Smokeless tobacco: Never  Vaping Use   Vaping status: Never Used  Substance  and Sexual Activity   Alcohol use: Yes    Comment: occasionally   Drug use: No   Sexual activity: Not on file  Other Topics Concern   Not on file  Social History Narrative   Patient married, has 2 children, son and daughter.    Social Drivers of Health   Tobacco Use: Low Risk (07/18/2024)   Patient History    Smoking Tobacco Use: Never    Smokeless Tobacco Use: Never    Passive Exposure: Not on file  Financial Resource Strain: Low Risk (07/17/2024)   Overall Financial Resource Strain (CARDIA)    Difficulty of Paying Living Expenses: Not hard at all  Food Insecurity: No Food Insecurity (07/17/2024)   Epic    Worried About Radiation Protection Practitioner of Food in the Last Year: Never true    Ran Out of Food in the Last Year: Never true  Transportation Needs: No Transportation Needs (07/17/2024)   Epic    Lack of Transportation (Medical): No    Lack of Transportation (Non-Medical): No  Physical Activity:  Sufficiently Active (07/17/2024)   Exercise Vital Sign    Days of Exercise per Week: 5 days    Minutes of Exercise per Session: 40 min  Stress: No Stress Concern Present (07/17/2024)   [employer] of Occupational Health - Occupational Stress Questionnaire    Feeling of Stress: Only a little  Social Connections: Socially Integrated (07/17/2024)   Social Connection and Isolation Panel    Frequency of Communication with Friends and Family: Twice a week    Frequency of Social Gatherings with Friends and Family: Once a week    Attends Religious Services: 1 to 4 times per year    Active Member of Clubs or Organizations: Yes    Attends Engineer, Structural: More than 4 times per year    Marital Status: Married  Catering Manager Violence: Not on file  Depression (PHQ2-9): Low Risk (07/18/2024)   Depression (PHQ2-9)    PHQ-2 Score: 0  Alcohol Screen: Low Risk (07/17/2024)   Alcohol Screen    Last Alcohol Screening Score (AUDIT): 4  Housing: Low Risk (07/17/2024)   Epic    Unable to Pay for Housing in the Last Year: No    Number of Times Moved in the Last Year: 0    Homeless in the Last Year: No  Utilities: Low Risk (06/28/2023)   Received from [facility]   Utilities    In the past 12 months has the electric, gas, oil, or water company threatened to shut off services in your home? : No  Health Literacy: Not on file   History reviewed. No pertinent family history. Allergies[2]   Patient Care Team: Aletha Bene, MD as PCP - General (Family Medicine)   Show/hide medication list[3]  ROS     Objective:    BP 120/70   Pulse 85   Ht 4' 10 (1.473 m)   Wt 111 lb 2 oz (50.4 kg)   SpO2 98%   BMI 23.23 kg/m  BP Readings from Last 3 Encounters:  07/18/24 120/70  02/04/24 122/82  01/14/24 (!) 110/56   Wt Readings from Last 3 Encounters:  07/18/24 111 lb 2 oz (50.4 kg)  02/04/24 111 lb 2 oz (50.4 kg)  01/14/24 107 lb (48.5 kg)    Physical Exam Vitals and nursing  note reviewed.  Constitutional:      General: She is not in acute distress.    Appearance: Normal appearance.  HENT:  Head: Normocephalic.     Right Ear: Tympanic membrane, ear canal and external ear normal.     Left Ear: Tympanic membrane, ear canal and external ear normal.  Eyes:     Extraocular Movements: Extraocular movements intact.     Pupils: Pupils are equal, round, and reactive to light.  Cardiovascular:     Rate and Rhythm: Normal rate and regular rhythm.     Heart sounds: Normal heart sounds.  Pulmonary:     Effort: Pulmonary effort is normal.     Breath sounds: Normal breath sounds. No wheezing or rhonchi.  Chest:  Breasts:    Right: Normal. No bleeding, inverted nipple, mass, nipple discharge, skin change or tenderness.     Left: Normal. No bleeding, inverted nipple, mass, nipple discharge, skin change or tenderness.  Abdominal:     General: Bowel sounds are normal.     Tenderness: There is no guarding or rebound.  Musculoskeletal:     Lumbar back: Decreased range of motion.     Right lower leg: No edema.     Left lower leg: No edema.     Comments: Patient unable to touch her toes but no discomfort with forward flexion  Lymphadenopathy:     Upper Body:     Right upper body: No axillary adenopathy.     Left upper body: No axillary adenopathy.  Skin:    General: Skin is warm.     Comments: Has 3-4 mm verrucous lesion over left upper arm Left foot-her aspect she had a previous corn/callus that is essentially resolved-not tender to palpation  Neurological:     General: No focal deficit present.     Mental Status: She is alert and oriented to person, place, and time.     Cranial Nerves: Cranial nerves 2-12 are intact. No cranial nerve deficit.     Sensory: No sensory deficit.     Motor: Motor function is intact. No weakness.     Coordination: Coordination is intact. Romberg sign negative. Coordination normal. Finger-Nose-Finger Test normal.     Gait: Gait is  intact. Gait and tandem walk normal.     Deep Tendon Reflexes: Reflexes are normal and symmetric. Reflexes normal.  Psychiatric:        Mood and Affect: Mood normal.        Behavior: Behavior normal.        Thought Content: Thought content normal.        Judgment: Judgment normal.      Results for orders placed or performed in visit on 07/18/24  [facility] COLONOSCOPY  Result Value Ref Range   [facility] Colonoscopy See Report (in chart) See Report (in chart), Patient Reported        Connie Emperor, MD      [1]  Social History Tobacco Use   Smoking status: Never   Smokeless tobacco: Never  Vaping Use   Vaping status: Never Used  Substance Use Topics   Alcohol use: Yes    Comment: occasionally   Drug use: No  [2] No Known Allergies [3]  Outpatient Medications Prior to Visit  Medication Sig   acetaminophen  (TYLENOL ) 500 MG tablet Take 1,000 mg by mouth 2 (two) times daily as needed for moderate pain.   albuterol  (PROVENTIL  HFA;VENTOLIN  HFA) 108 (90 Base) MCG/ACT inhaler Inhale 2 puffs into the lungs every 6 (six) hours as needed for wheezing or shortness of breath.    Calcium  Carb-Cholecalciferol  (CALCIUM  600+D) 600-800 MG-UNIT TABS Take 2 tablets by mouth  daily.   fluticasone  (FLONASE) 50 MCG/ACT nasal spray Place 2 sprays into the nose daily.   fluticasone -salmeterol (ADVAIR) 100-50 MCG/ACT AEPB Inhale 1 puff into the lungs 2 (two) times daily.   magnesium gluconate (MAGONATE) 500 MG tablet Take 500 mg by mouth 2 (two) times daily.   meclizine  (ANTIVERT ) 12.5 MG tablet Take 1 tablet (12.5 mg total) by mouth 3 (three) times daily as needed for dizziness.   Multiple Vitamin (MULTIVITAMIN) tablet Take 1 tablet by mouth daily.   [DISCONTINUED] cephALEXin  (KEFLEX ) 500 MG capsule Take 1 capsule (500 mg total) by mouth 4 (four) times daily. (Patient not taking: Reported on 02/04/2024)   No facility-administered medications prior to visit.   "

## 2024-07-19 ENCOUNTER — Ambulatory Visit: Payer: Self-pay | Admitting: Family Medicine

## 2024-07-19 LAB — CBC WITH DIFFERENTIAL/PLATELET
Absolute Lymphocytes: 1793 {cells}/uL (ref 850–3900)
Absolute Monocytes: 711 {cells}/uL (ref 200–950)
Basophils Absolute: 71 {cells}/uL (ref 0–200)
Basophils Relative: 0.9 %
Eosinophils Absolute: 198 {cells}/uL (ref 15–500)
Eosinophils Relative: 2.5 %
HCT: 42.3 % (ref 35.9–46.0)
Hemoglobin: 13.9 g/dL (ref 11.7–15.5)
MCH: 29.2 pg (ref 27.0–33.0)
MCHC: 32.9 g/dL (ref 31.6–35.4)
MCV: 88.9 fL (ref 81.4–101.7)
MPV: 12 fL (ref 7.5–12.5)
Monocytes Relative: 9 %
Neutro Abs: 5127 {cells}/uL (ref 1500–7800)
Neutrophils Relative %: 64.9 %
Platelets: 244 10*3/uL (ref 140–400)
RBC: 4.76 Million/uL (ref 3.80–5.10)
RDW: 12.8 % (ref 11.0–15.0)
Total Lymphocyte: 22.7 %
WBC: 7.9 10*3/uL (ref 3.8–10.8)

## 2024-07-19 LAB — LIPID PANEL
Cholesterol: 325 mg/dL — ABNORMAL HIGH
HDL: 109 mg/dL
LDL Cholesterol (Calc): 183 mg/dL — ABNORMAL HIGH
Non-HDL Cholesterol (Calc): 216 mg/dL — ABNORMAL HIGH
Total CHOL/HDL Ratio: 3 (calc)
Triglycerides: 178 mg/dL — ABNORMAL HIGH

## 2024-07-19 LAB — COMPREHENSIVE METABOLIC PANEL WITH GFR
AG Ratio: 1.8 (calc) (ref 1.0–2.5)
ALT: 34 U/L — ABNORMAL HIGH (ref 6–29)
AST: 29 U/L (ref 10–35)
Albumin: 4.4 g/dL (ref 3.6–5.1)
Alkaline phosphatase (APISO): 67 U/L (ref 37–153)
BUN: 18 mg/dL (ref 7–25)
CO2: 30 mmol/L (ref 20–32)
Calcium: 10.4 mg/dL (ref 8.6–10.4)
Chloride: 102 mmol/L (ref 98–110)
Creat: 0.86 mg/dL (ref 0.60–1.00)
Globulin: 2.4 g/dL (ref 1.9–3.7)
Glucose, Bld: 94 mg/dL (ref 65–99)
Potassium: 4.3 mmol/L (ref 3.5–5.3)
Sodium: 141 mmol/L (ref 135–146)
Total Bilirubin: 0.3 mg/dL (ref 0.2–1.2)
Total Protein: 6.8 g/dL (ref 6.1–8.1)
eGFR: 72 mL/min/{1.73_m2}

## 2024-07-19 LAB — VITAMIN D 25 HYDROXY (VIT D DEFICIENCY, FRACTURES): Vit D, 25-Hydroxy: 59 ng/mL (ref 30–100)

## 2024-07-19 LAB — TSH: TSH: 2.35 m[IU]/L (ref 0.40–4.50)

## 2024-07-26 ENCOUNTER — Other Ambulatory Visit (HOSPITAL_BASED_OUTPATIENT_CLINIC_OR_DEPARTMENT_OTHER)

## 2024-07-27 ENCOUNTER — Encounter (HOSPITAL_BASED_OUTPATIENT_CLINIC_OR_DEPARTMENT_OTHER): Payer: Self-pay

## 2024-07-27 ENCOUNTER — Ambulatory Visit (HOSPITAL_BASED_OUTPATIENT_CLINIC_OR_DEPARTMENT_OTHER)
Admission: RE | Admit: 2024-07-27 | Discharge: 2024-07-27 | Disposition: A | Source: Ambulatory Visit | Attending: Family Medicine | Admitting: Family Medicine

## 2024-07-27 DIAGNOSIS — E785 Hyperlipidemia, unspecified: Secondary | ICD-10-CM

## 2024-07-27 DIAGNOSIS — Z1231 Encounter for screening mammogram for malignant neoplasm of breast: Secondary | ICD-10-CM

## 2025-01-16 ENCOUNTER — Ambulatory Visit: Admitting: Family Medicine
